# Patient Record
Sex: Male | Born: 1975 | Race: Black or African American | Hispanic: No | Marital: Married | Smoking: Former smoker
Health system: Southern US, Community
[De-identification: ages and names within clinical notes are randomized; demographics above are authoritative.]

## PROBLEM LIST (undated history)

## (undated) DIAGNOSIS — J189 Pneumonia, unspecified organism: Secondary | ICD-10-CM

## (undated) DIAGNOSIS — J329 Chronic sinusitis, unspecified: Secondary | ICD-10-CM

## (undated) DIAGNOSIS — U071 COVID-19: Secondary | ICD-10-CM

## (undated) DIAGNOSIS — R490 Dysphonia: Secondary | ICD-10-CM

## (undated) DIAGNOSIS — J381 Polyp of vocal cord and larynx: Secondary | ICD-10-CM

## (undated) DIAGNOSIS — B019 Varicella without complication: Secondary | ICD-10-CM

## (undated) DIAGNOSIS — F419 Anxiety disorder, unspecified: Secondary | ICD-10-CM

## (undated) DIAGNOSIS — E785 Hyperlipidemia, unspecified: Secondary | ICD-10-CM

## (undated) HISTORY — DX: Hyperlipidemia, unspecified: E78.5

## (undated) HISTORY — PX: OTHER SURGICAL HISTORY: SHX169

## (undated) HISTORY — DX: Anxiety disorder, unspecified: F41.9

## (undated) HISTORY — DX: COVID-19: U07.1

## (undated) HISTORY — DX: Chronic sinusitis, unspecified: J32.9

## (undated) HISTORY — DX: Dysphonia: R49.0

## (undated) HISTORY — DX: Polyp of vocal cord and larynx: J38.1

## (undated) HISTORY — PX: ROOT CANAL: SHX2363

## (undated) HISTORY — DX: Pneumonia, unspecified organism: J18.9

## (undated) HISTORY — DX: Varicella without complication: B01.9

---

## 2008-07-22 ENCOUNTER — Emergency Department: Payer: Self-pay | Admitting: Emergency Medicine

## 2008-10-20 ENCOUNTER — Emergency Department: Payer: Self-pay | Admitting: Unknown Physician Specialty

## 2010-06-30 ENCOUNTER — Emergency Department (HOSPITAL_COMMUNITY)
Admission: EM | Admit: 2010-06-30 | Discharge: 2010-06-30 | Disposition: A | Discharge status: Home / Self Care | Payer: Self-pay | Attending: Emergency Medicine | Admitting: Emergency Medicine

## 2010-06-30 DIAGNOSIS — L42 Pityriasis rosea: Secondary | ICD-10-CM | POA: Insufficient documentation

## 2010-06-30 DIAGNOSIS — R21 Rash and other nonspecific skin eruption: Secondary | ICD-10-CM | POA: Insufficient documentation

## 2010-06-30 DIAGNOSIS — L299 Pruritus, unspecified: Secondary | ICD-10-CM | POA: Insufficient documentation

## 2012-03-11 ENCOUNTER — Emergency Department: Payer: Self-pay | Admitting: Emergency Medicine

## 2012-11-09 ENCOUNTER — Emergency Department: Payer: Self-pay | Admitting: Emergency Medicine

## 2017-04-02 ENCOUNTER — Other Ambulatory Visit: Payer: Self-pay

## 2017-04-02 ENCOUNTER — Emergency Department
Admission: EM | Admit: 2017-04-02 | Discharge: 2017-04-02 | Disposition: A | Discharge status: Home / Self Care | Payer: 59 | Attending: Emergency Medicine | Admitting: Emergency Medicine

## 2017-04-02 ENCOUNTER — Emergency Department: Payer: 59

## 2017-04-02 DIAGNOSIS — R0789 Other chest pain: Secondary | ICD-10-CM | POA: Diagnosis not present

## 2017-04-02 DIAGNOSIS — F17210 Nicotine dependence, cigarettes, uncomplicated: Secondary | ICD-10-CM | POA: Insufficient documentation

## 2017-04-02 DIAGNOSIS — R002 Palpitations: Secondary | ICD-10-CM

## 2017-04-02 LAB — CBC
HCT: 44.4 % (ref 40.0–52.0)
Hemoglobin: 14.7 g/dL (ref 13.0–18.0)
MCH: 26.7 pg (ref 26.0–34.0)
MCHC: 33 g/dL (ref 32.0–36.0)
MCV: 80.8 fL (ref 80.0–100.0)
PLATELETS: 283 10*3/uL (ref 150–440)
RBC: 5.49 MIL/uL (ref 4.40–5.90)
RDW: 14.8 % — AB (ref 11.5–14.5)
WBC: 9 10*3/uL (ref 3.8–10.6)

## 2017-04-02 LAB — FIBRIN DERIVATIVES D-DIMER (ARMC ONLY): Fibrin derivatives D-dimer (ARMC): 251.67 ng/mL (FEU) (ref 0.00–499.00)

## 2017-04-02 LAB — BASIC METABOLIC PANEL
Anion gap: 11 (ref 5–15)
BUN: 15 mg/dL (ref 6–20)
CALCIUM: 9.5 mg/dL (ref 8.9–10.3)
CHLORIDE: 105 mmol/L (ref 101–111)
CO2: 23 mmol/L (ref 22–32)
CREATININE: 1.11 mg/dL (ref 0.61–1.24)
GFR calc Af Amer: >60 mL/min (ref >60)
GFR calc non Af Amer: >60 mL/min (ref >60)
Glucose, Bld: 112 mg/dL — ABNORMAL HIGH (ref 65–99)
Potassium: 3.5 mmol/L (ref 3.5–5.1)
SODIUM: 139 mmol/L (ref 135–145)

## 2017-04-02 LAB — TROPONIN I
Troponin I: <0.03 ng/mL (ref <0.03)
Troponin I: <0.03 ng/mL (ref <0.03)

## 2017-04-02 NOTE — ED Notes (Signed)
Pt states was playing video games, felt nauseous and "not right" so came to be seen. Also belching.

## 2017-04-02 NOTE — Discharge Instructions (Signed)
Return to the ER for new, worsening, or persistent chest discomfort, palpitations, difficulty breathing, weakness or lightheadedness, or any other new or worsening symptoms that concern you.  Follow-up with your primary care doctor, and you can also make an appointment with the cardiologist via the referral included here.

## 2017-04-02 NOTE — ED Provider Notes (Signed)
[facility] Emergency Department Provider Note ____________________________________________   First MD Initiated Contact with Patient 04/02/17 1734     (approximate)  I have reviewed the triage vital signs and the nursing notes.   HISTORY  Chief Complaint Chest Pain    HPI Christopher Small is a 42 y.o. male with no significant past medical history who presents with chest pain, acute onset approximately 2 hours ago while the patient was playing video games, described as discomfort, and associated with palpitations.  He also states that same time he became lightheaded and nauseous.  Patient states that the symptoms started to improve after about 5-10 minutes, and now have completely resolved.  No prior history of this symptom or similar episode.  Patient reports somewhat increased stress at work, but no acute stressor or anxiety preceding this episode.  He denies any leg swelling or acute pain.  He does report some tingling and "soreness" in the left side of his body over the last several days, but no weakness or numbness.   History reviewed. No pertinent past medical history.  There are no active problems to display for this patient.   History reviewed. No pertinent surgical history.  Prior to Admission medications   Not on File    Allergies Patient has no known allergies.  No family history on file.  Social History Social History   Tobacco Use  . Smoking status: Current Every Day Smoker    Types: Cigarettes  . Smokeless tobacco: Never Used  Substance Use Topics  . Alcohol use: Yes  . Drug use: Not on file    Review of Systems  Constitutional: No fever. Eyes: No visual changes. ENT: No neck pain. Cardiovascular: Positive for resolved chest pain. Respiratory: Denies shortness of breath. Gastrointestinal: Positive for resolved nausea.  No vomiting.  Genitourinary: Negative for flank pain.  Musculoskeletal: Negative for back pain. Skin:  Negative for rash. Neurological: Negative for headaches, focal weakness or numbness.   ____________________________________________   PHYSICAL EXAM:  VITAL SIGNS: ED Triage Vitals  Enc Vitals Group     BP 04/02/17 1520 (!) 163/79     Pulse Rate 04/02/17 1520 (!) 121     Resp 04/02/17 1520 18     Temp 04/02/17 1520 98.2 F (36.8 C)     Temp Source 04/02/17 1520 Oral     SpO2 04/02/17 1520 100 %     Weight 04/02/17 1518 225 lb (102.1 kg)     Height 04/02/17 1518 5\' 8"  (1.727 m)     Head Circumference --      Peak Flow --      Pain Score --      Pain Loc --      Pain Edu? --      Excl. in [place]? --     Constitutional: Alert and oriented. Well appearing and in no acute distress. Eyes: Conjunctivae are normal.  Head: Atraumatic. Nose: No congestion/rhinnorhea. Mouth/Throat: Mucous membranes are moist.   Neck: Normal range of motion.  Cardiovascular: Borderline rapid heart rate, regular rhythm. Grossly normal heart sounds.  Good peripheral circulation. Respiratory: Normal respiratory effort.  No retractions. Lungs CTAB. Gastrointestinal: Soft and nontender. No distention.  Genitourinary: No CVA tenderness. Musculoskeletal: No lower extremity edema.  Extremities warm and well perfused.  No calf or popliteal swelling or tenderness. Neurologic:  Normal speech and language. No gross focal neurologic deficits are appreciated.  Skin:  Skin is warm and dry. No rash noted. Psychiatric: Mood and  affect are normal. Speech and behavior are normal.  ____________________________________________   LABS (all labs ordered are listed, but only abnormal results are displayed)  Labs Reviewed  BASIC METABOLIC PANEL - Abnormal; Notable for the following components:      Result Value   Glucose, Bld 112 (*)    All other components within normal limits  CBC - Abnormal; Notable for the following components:   RDW 14.8 (*)    All other components within normal limits  TROPONIN I  FIBRIN  DERIVATIVES D-DIMER ([facility] ONLY)  TROPONIN I   ____________________________________________  EKG  ED ECG REPORT I, Arta Silence, the attending physician, personally viewed and interpreted this ECG.  Date: 04/02/2017 EKG Time: 1517 Rate: 119 Rhythm: Sinus tachycardia QRS Axis: normal Intervals: normal ST/T Wave abnormalities: Nonspecific ST flattening laterally Narrative Interpretation: no evidence of acute ischemia  ____________________________________________  RADIOLOGY  CXR: No focal infiltrate or other acute findings  ____________________________________________   PROCEDURES  Procedure(s) performed: No  Procedures  Critical Care performed: No ____________________________________________   INITIAL IMPRESSION / ASSESSMENT AND PLAN / ED COURSE  Pertinent labs & imaging results that were available during my care of the patient were reviewed by me and considered in my medical decision making (see chart for details).  42 year old male with no significant past medical history presents with atypical and now resolved chest discomfort and palpitations associated with lightheadedness and nausea that occurred while he was playing video games, and resolved after approximately 10 minutes.  No prior history of similar symptoms.  Past medical records reviewed in epic and are noncontributory.  On exam, the patient is well-appearing.  Heart rate was 120 on arrival, however by the time of my exam the patient's heart rate was just under 100.  Other vital signs are normal.  The remainder the exam is unremarkable.  Overall presentation is most consistent with anxiety, vasovagal near syncope, GERD, musculoskeletal pain, or other benign cause.  Differential also includes resolved arrhythmia, although the EKG does not show acute findings associated with any specific arrhythmia.  I have a lower suspicion for ACS given the patient's age and lack of risk factors.  PE is unlikely given  the resolved symptoms and improved tachycardia, however given that the patient did have unexplained tachycardia associated with this episode that was sustained for some time, will obtain a d-dimer to rule it out as well as troponins x2, chest x-ray, and basic labs.   ----------------------------------------- 7:17 PM on 04/02/2017 -----------------------------------------  Patient continues to be asymptomatic.  Heart rate has normalized to the 90s.  Repeat troponin and d-dimer are both negative.  I discussed the results of the workup and the possible causes with the patient.  At this time he is safe for discharge home.  He is arranging follow-up with a primary care doctor, and I also provided a referral to a cardiologist in our system.  Return precautions given, and the patient expresses understanding.  ____________________________________________   FINAL CLINICAL IMPRESSION(S) / ED DIAGNOSES  Final diagnoses:  Atypical chest pain  Palpitations      NEW MEDICATIONS STARTED DURING THIS VISIT:  New Prescriptions   No medications on file     Note:  This document was prepared using Dragon voice recognition software and may include unintentional dictation errors.    Arta Silence, MD 04/02/17 [phone]

## 2017-04-02 NOTE — ED Notes (Signed)
FIRST NURSE NOTE:  Pt reports tightness in chest and shortness of breath, pt ambulatory in lobby without difficulty. Pt declined wheelchair.

## 2017-04-02 NOTE — ED Triage Notes (Signed)
Pt states he was sitting down playing a game and had sudden onset chest pain with feeling like his heart was beating fast with SOB. States after a few minutes the sx resolved.the patient denies any pain or palpitations at present.

## 2017-05-25 ENCOUNTER — Ambulatory Visit: Payer: 59 | Admitting: Internal Medicine

## 2017-07-12 ENCOUNTER — Ambulatory Visit: Payer: 59 | Admitting: Internal Medicine

## 2017-07-12 ENCOUNTER — Encounter: Payer: Self-pay | Admitting: Internal Medicine

## 2017-07-12 VITALS — BP 130/80 | HR 75 | Temp 98.5°F | Ht 68.0 in | Wt 219.2 lb

## 2017-07-12 DIAGNOSIS — R490 Dysphonia: Secondary | ICD-10-CM

## 2017-07-12 DIAGNOSIS — K219 Gastro-esophageal reflux disease without esophagitis: Secondary | ICD-10-CM | POA: Diagnosis not present

## 2017-07-12 DIAGNOSIS — Z1329 Encounter for screening for other suspected endocrine disorder: Secondary | ICD-10-CM

## 2017-07-12 DIAGNOSIS — Z1159 Encounter for screening for other viral diseases: Secondary | ICD-10-CM | POA: Diagnosis not present

## 2017-07-12 DIAGNOSIS — R142 Eructation: Secondary | ICD-10-CM | POA: Diagnosis not present

## 2017-07-12 DIAGNOSIS — R739 Hyperglycemia, unspecified: Secondary | ICD-10-CM

## 2017-07-12 DIAGNOSIS — E559 Vitamin D deficiency, unspecified: Secondary | ICD-10-CM

## 2017-07-12 DIAGNOSIS — Z0184 Encounter for antibody response examination: Secondary | ICD-10-CM

## 2017-07-12 DIAGNOSIS — Z1389 Encounter for screening for other disorder: Secondary | ICD-10-CM | POA: Diagnosis not present

## 2017-07-12 DIAGNOSIS — Z0000 Encounter for general adult medical examination without abnormal findings: Secondary | ICD-10-CM | POA: Diagnosis not present

## 2017-07-12 DIAGNOSIS — Z13220 Encounter for screening for lipoid disorders: Secondary | ICD-10-CM | POA: Diagnosis not present

## 2017-07-12 DIAGNOSIS — R1310 Dysphagia, unspecified: Secondary | ICD-10-CM | POA: Diagnosis not present

## 2017-07-12 HISTORY — DX: Dysphonia: R49.0

## 2017-07-12 HISTORY — DX: Gastro-esophageal reflux disease without esophagitis: K21.9

## 2017-07-12 HISTORY — DX: Eructation: R14.2

## 2017-07-12 MED ORDER — PANTOPRAZOLE SODIUM 40 MG PO TBEC: 40.0000 mg | DELAYED_RELEASE_TABLET | Freq: Every day | ORAL | 0 refills | Status: DC

## 2017-07-12 NOTE — Progress Notes (Signed)
Patient not in [facility]. 

## 2017-07-12 NOTE — Progress Notes (Addendum)
Chief Complaint  Patient presents with  . New Patient (Initial Visit)   New patient  1. C/o since 03/2017 when he went to ED for URI (CXR neg 04/02/17) and racing heart, he has been belching a lot, hoarse in the am and feeling like something is stuck in his throat. Tried Nexium ? Dose and unknown if it helps. He does like gas causing foods I.e onions, garlic and brocolli 2. He reports when he was kid K-5th grade he had to use a voice box for some reason and his "glands" in his neck were swollen when disc'ed with mom recently. With h/o hoarseness and this history will refer to ENT  3. Tobacco abuse smoking since age 42.  Now down to 1-2 cig was max 1/2 ppd  No known FH lung cancer. Now using nicotine patch  Review of Systems  Constitutional: Negative for weight loss.  HENT:       +hoarse voice   Eyes: Negative for blurred vision.  Respiratory: Negative for shortness of breath.   Cardiovascular: Negative for chest pain.  Gastrointestinal:       +belching   Musculoskeletal: Negative for falls.  Skin: Negative for rash.  Neurological: Negative for headaches.  Psychiatric/Behavioral: Negative for depression.   Past Medical History:  Diagnosis Date  . Chicken pox   . Pneumonia    newborn   Past Surgical History:  Procedure Laterality Date  . ROOT CANAL     Family History  Problem Relation Age of Onset  . Diabetes Mother   . Hypertension Mother   . Stroke Mother   . Alcohol abuse Father   . Cancer Maternal Aunt        >type   . Diabetes Paternal Grandmother   . Heart disease Paternal Grandmother   . Cancer Paternal Grandmother        ?type cancer   . Stroke Paternal Grandfather   . Asthma Brother    Social History   Socioeconomic History  . Marital status: Married    Spouse name: Not on file  . Number of children: Not on file  . Years of education: Not on file  . Highest education level: Not on file  Occupational History  . Not on file  Social Needs  . Financial  resource strain: Not on file  . Food insecurity:    Worry: Not on file    Inability: Not on file  . Transportation needs:    Medical: Not on file    Non-medical: Not on file  Tobacco Use  . Smoking status: Current Every Day Smoker    Types: Cigarettes  . Smokeless tobacco: Never Used  . Tobacco comment: smoker since age 21 on and off now 1-2 cig/day max 1/2 ppd   Substance and Sexual Activity  . Alcohol use: Yes  . Drug use: Yes    Comment: thc sometimes   . Sexual activity: Yes  Lifestyle  . Physical activity:    Days per week: Not on file    Minutes per session: Not on file  . Stress: Not on file  Relationships  . Social connections:    Talks on phone: Not on file    Gets together: Not on file    Attends religious service: Not on file    Active member of club or organization: Not on file    Attends meetings of clubs or organizations: Not on file    Relationship status: Not on file  . Intimate partner violence:  Fear of current or ex partner: Not on file    Emotionally abused: Not on file    Physically abused: Not on file    Forced sexual activity: Not on file  Other Topics Concern  . Not on file  Social History Narrative   Married    Former Occupational psychologist    2 brothers    Current Meds  Medication Sig  . nicotine (NICODERM CQ - DOSED IN MG/24 HOURS) 21 mg/24hr patch Place 21 mg onto the skin daily.   No Known Allergies No results found for this or any previous visit (from the past 2160 hour(s)). Objective  Body mass index is 33.33 kg/m. Wt Readings from Last 3 Encounters:  07/12/17 219 lb 3.2 oz (99.4 kg)  04/02/17 225 lb (102.1 kg)   Temp Readings from Last 3 Encounters:  07/12/17 98.5 F (36.9 C) (Oral)  04/02/17 98.2 F (36.8 C) (Oral)   BP Readings from Last 3 Encounters:  07/12/17 130/80  04/02/17 128/89   Pulse Readings from Last 3 Encounters:  07/12/17 75  04/02/17 81    Physical Exam  Constitutional:  He is oriented to person, place, and time. Vital signs are normal. He appears well-developed and well-nourished. He is cooperative.  HENT:  Head: Normocephalic and atraumatic.  Mouth/Throat: Oropharynx is clear and moist and mucous membranes are normal.  Eyes: Pupils are equal, round, and reactive to light. Conjunctivae are normal.  Cardiovascular: Normal rate, regular rhythm and normal heart sounds.  Pulmonary/Chest: Effort normal and breath sounds normal.  Neurological: He is alert and oriented to person, place, and time. Gait normal.  Skin: Skin is warm, dry and intact.  Psychiatric: He has a normal mood and affect. His speech is normal and behavior is normal. Judgment and thought content normal. Cognition and memory are normal.  Nursing note and vitals reviewed.   Assessment   1. Hoarse voice  2. Belching and #1 c/w GERD, sensation of food getting stuck in throat/dysphagia  3. Tobacco abuse  4. HM Plan   07/26/17 saw Dr. Tami Ribas smooth polypoid mass left cord and nodule on right ? Inflamed polyp but bx to be sure pt to think about bx  1. Refer to GI and ENT to look into see HPI  2. See #1  Likely needs EGD Trial of protonix 40 rec smoking cessation  3. Nicotine patch  rec smoking cessation  4.  Never had flu shot  Think about pna 23  Tdap had 2012/13 check [facility]   sch fasting labs  Declines std check   Provider: Dr. Olivia Mackie McLean-Scocuzza-Internal Medicine

## 2017-07-12 NOTE — Progress Notes (Signed)
Pre visit review using our clinic review tool, if applicable. No additional management support is needed unless otherwise documented below in the visit note. 

## 2017-07-12 NOTE — Patient Instructions (Addendum)
We will refer you to [facility]  Think about pneumonia 23 vaccine    Gastroesophageal Reflux Disease, Adult Normally, food travels down the esophagus and stays in the stomach to be digested. If a person has gastroesophageal reflux disease (GERD), food and stomach acid move back up into the esophagus. When this happens, the esophagus becomes sore and swollen (inflamed). Over time, GERD can make small holes (ulcers) in the lining of the esophagus. Follow these instructions at home: Diet  Follow a diet as told by your doctor. You may need to avoid foods and drinks such as: ? Coffee and tea (with or without caffeine). ? Drinks that contain alcohol. ? Energy drinks and sports drinks. ? Carbonated drinks or sodas. ? Chocolate and cocoa. ? Peppermint and mint flavorings. ? Garlic and onions. ? Horseradish. ? Spicy and acidic foods, such as peppers, chili powder, curry powder, vinegar, hot sauces, and BBQ sauce. ? Citrus fruit juices and citrus fruits, such as oranges, lemons, and limes. ? Tomato-based foods, such as red sauce, chili, salsa, and pizza with red sauce. ? Fried and fatty foods, such as donuts, french fries, potato chips, and high-fat dressings. ? High-fat meats, such as hot dogs, rib eye steak, sausage, ham, and bacon. ? High-fat dairy items, such as whole milk, butter, and cream cheese.  Eat small meals often. Avoid eating large meals.  Avoid drinking large amounts of liquid with your meals.  Avoid eating meals during the 2-3 hours before bedtime.  Avoid lying down right after you eat.  Do not exercise right after you eat. General instructions  Pay attention to any changes in your symptoms.  Take over-the-counter and prescription medicines only as told by your doctor. Do not take aspirin, ibuprofen, or other NSAIDs unless your doctor says it is okay.  Do not use any tobacco products, including cigarettes, chewing tobacco, and e-cigarettes. If you need  help quitting, ask your doctor.  Wear loose clothes. Do not wear anything tight around your waist.  Raise (elevate) the head of your bed about 6 inches (15 cm).  Try to lower your stress. If you need help doing this, ask your doctor.  If you are overweight, lose an amount of weight that is healthy for you. Ask your doctor about a safe weight loss goal.  Keep all follow-up visits as told by your doctor. This is important. Contact a doctor if:  You have new symptoms.  You lose weight and you do not know why it is happening.  You have trouble swallowing, or it hurts to swallow.  You have wheezing or a cough that keeps happening.  Your symptoms do not get better with treatment.  You have a hoarse voice. Get help right away if:  You have pain in your arms, neck, jaw, teeth, or back.  You feel sweaty, dizzy, or light-headed.  You have chest pain or shortness of breath.  You throw up (vomit) and your throw up looks like blood or coffee grounds.  You pass out (faint).  Your poop (stool) is bloody or black.  You cannot swallow, drink, or eat. This information is not intended to replace advice given to you by your health care provider. Make sure you discuss any questions you have with your health care provider. Document Released: 06/29/2007 Document Revised: 06/18/2015 Document Reviewed: 05/07/2014 Elsevier Interactive Patient Education  2018 [employer] for Gastroesophageal Reflux Disease, Adult When you have gastroesophageal reflux disease (GERD), the foods  you eat and your eating habits are very important. Choosing the right foods can help ease your discomfort. What guidelines do I need to follow?  Choose fruits, vegetables, whole grains, and low-fat dairy products.  Choose low-fat meat, fish, and poultry.  Limit fats such as oils, salad dressings, butter, nuts, and avocado.  Keep a food diary. This helps you identify foods that cause  symptoms.  Avoid foods that cause symptoms. These may be different for everyone.  Eat small meals often instead of 3 large meals a day.  Eat your meals slowly, in a place where you are relaxed.  Limit fried foods.  Cook foods using methods other than frying.  Avoid drinking alcohol.  Avoid drinking large amounts of liquids with your meals.  Avoid bending over or lying down until 2-3 hours after eating. What foods are not recommended? These are some foods and drinks that may make your symptoms worse: Vegetables Tomatoes. Tomato juice. Tomato and spaghetti sauce. Chili peppers. Onion and garlic. Horseradish. Fruits Oranges, grapefruit, and lemon (fruit and juice). Meats High-fat meats, fish, and poultry. This includes hot dogs, ribs, ham, sausage, salami, and bacon. Dairy Whole milk and chocolate milk. Sour cream. Cream. Butter. Ice cream. Cream cheese. Drinks Coffee and tea. Bubbly (carbonated) drinks or energy drinks. Condiments Hot sauce. Barbecue sauce. Sweets/Desserts Chocolate and cocoa. Donuts. Peppermint and spearmint. Fats and Oils High-fat foods. This includes [place] fries and potato chips. Other Vinegar. Strong spices. This includes black pepper, white pepper, red pepper, cayenne, curry powder, cloves, ginger, and chili powder. The items listed above may not be a complete list of foods and drinks to avoid. Contact your dietitian for more information. This information is not intended to replace advice given to you by your health care provider. Make sure you discuss any questions you have with your health care provider. Document Released: 07/12/2011 Document Revised: 06/18/2015 Document Reviewed: 11/14/2012 Elsevier Interactive Patient Education  2017 [employer].    Pneumococcal Polysaccharide Vaccine: What You Need to Know 1. Why get vaccinated? Vaccination can protect older adults (and some children and younger adults) from pneumococcal disease. Pneumococcal  disease is caused by bacteria that can spread from person to person through close contact. It can cause ear infections, and it can also lead to more serious infections of the:  Lungs (pneumonia),  Blood (bacteremia), and  Covering of the brain and spinal cord (meningitis). Meningitis can cause deafness and brain damage, and it can be fatal.  Anyone can get pneumococcal disease, but children under 77 years of age, people with certain medical conditions, adults over 4 years of age, and cigarette smokers are at the highest risk. About 18,000 older adults die each year from pneumococcal disease in the [place]. Treatment of pneumococcal infections with penicillin and other drugs used to be more effective. But some strains of the disease have become resistant to these drugs. This makes prevention of the disease, through vaccination, even more important. 2. Pneumococcal polysaccharide vaccine (PPSV23) Pneumococcal polysaccharide vaccine (PPSV23) protects against 23 types of pneumococcal bacteria. It will not prevent all pneumococcal disease. PPSV23 is recommended for:  All adults 66 years of age and older,  Anyone 2 through 44 years of age with certain long-term health problems,  Anyone 2 through 44 years of age with a weakened immune system,  Adults 39 through 44 years of age who smoke cigarettes or have asthma.  Most people need only one dose of PPSV. A second dose is recommended for certain high-risk  groups. People 77 and older should get a dose even if they have gotten one or more doses of the vaccine before they turned 65. Your healthcare provider can give you more information about these recommendations. Most healthy adults develop protection within 2 to 3 weeks of getting the shot. 3. Some people should not get this vaccine  Anyone who has had a life-threatening allergic reaction to PPSV should not get another dose.  Anyone who has a severe allergy to any component of PPSV should  not receive it. Tell your provider if you have any severe allergies.  Anyone who is moderately or severely ill when the shot is scheduled may be asked to wait until they recover before getting the vaccine. Someone with a mild illness can usually be vaccinated.  Children less than 15 years of age should not receive this vaccine.  There is no evidence that PPSV is harmful to either a pregnant woman or to her fetus. However, as a precaution, women who need the vaccine should be vaccinated before becoming pregnant, if possible. 4. Risks of a vaccine reaction With any medicine, including vaccines, there is a chance of side effects. These are usually mild and go away on their own, but serious reactions are also possible. About half of people who get PPSV have mild side effects, such as redness or pain where the shot is given, which go away within about two days. Less than 1 out of 100 people develop a fever, muscle aches, or more severe local reactions. Problems that could happen after any vaccine:  People sometimes faint after a medical procedure, including vaccination. Sitting or lying down for about 15 minutes can help prevent fainting, and injuries caused by a fall. Tell your doctor if you feel dizzy, or have vision changes or ringing in the ears.  Some people get severe pain in the shoulder and have difficulty moving the arm where a shot was given. This happens very rarely.  Any medication can cause a severe allergic reaction. Such reactions from a vaccine are very rare, estimated at about 1 in a million doses, and would happen within a few minutes to a few hours after the vaccination. As with any medicine, there is a very remote chance of a vaccine causing a serious injury or death. The safety of vaccines is always being monitored. For more information, visit: http://www.aguilar.org/ 5. What if there is a serious reaction? What should I look for? Look for anything that concerns you, such as  signs of a severe allergic reaction, very high fever, or unusual behavior. Signs of a severe allergic reaction can include hives, swelling of the face and throat, difficulty breathing, a fast heartbeat, dizziness, and weakness. These would usually start a few minutes to a few hours after the vaccination. What should I do? If you think it is a severe allergic reaction or other emergency that can't wait, call 9-1-1 or get to the nearest hospital. Otherwise, call your doctor. Afterward, the reaction should be reported to the Vaccine Adverse Event Reporting System (VAERS). Your doctor might file this report, or you can do it yourself through the VAERS web site at www.vaers.SamedayNews.es, or by calling 904 015 6512. VAERS does not give medical advice. 6. How can I learn more?  Ask your doctor. He or she can give you the vaccine package insert or suggest other sources of information.  Call your local or state health department.  Contact the Centers for Disease Control and Prevention (CDC): ? Call 331-744-1258 (1-800-CDC-INFO)  or ? Visit CDC's website at http://hunter.com/ CDC Pneumococcal Polysaccharide Vaccine VIS (05/17/13) This information is not intended to replace advice given to you by your health care provider. Make sure you discuss any questions you have with your health care provider. Document Released: 11/07/2005 Document Revised: 10/01/2015 Document Reviewed: 10/01/2015 Elsevier Interactive Patient Education  2017 [employer].

## 2017-07-26 DIAGNOSIS — Z720 Tobacco use: Secondary | ICD-10-CM | POA: Diagnosis not present

## 2017-07-26 DIAGNOSIS — R490 Dysphonia: Secondary | ICD-10-CM | POA: Diagnosis not present

## 2017-08-01 ENCOUNTER — Other Ambulatory Visit: Payer: Self-pay

## 2017-08-01 ENCOUNTER — Encounter: Payer: Self-pay | Admitting: Gastroenterology

## 2017-08-01 ENCOUNTER — Ambulatory Visit (INDEPENDENT_AMBULATORY_CARE_PROVIDER_SITE_OTHER): Payer: 59 | Admitting: Gastroenterology

## 2017-08-01 VITALS — BP 120/80 | HR 83 | Resp 17 | Ht 68.0 in | Wt 214.2 lb

## 2017-08-01 DIAGNOSIS — R0989 Other specified symptoms and signs involving the circulatory and respiratory systems: Secondary | ICD-10-CM

## 2017-08-01 DIAGNOSIS — K219 Gastro-esophageal reflux disease without esophagitis: Secondary | ICD-10-CM

## 2017-08-01 DIAGNOSIS — F458 Other somatoform disorders: Secondary | ICD-10-CM

## 2017-08-01 DIAGNOSIS — R198 Other specified symptoms and signs involving the digestive system and abdomen: Secondary | ICD-10-CM

## 2017-08-01 MED ORDER — PANTOPRAZOLE SODIUM 40 MG PO TBEC: 40.0000 mg | DELAYED_RELEASE_TABLET | Freq: Two times a day (BID) | ORAL | 0 refills | Status: DC

## 2017-08-01 NOTE — Progress Notes (Signed)
[name] MD, MRCP(U.K) [address]  [place]  [place], [place] [address]  Main: [phone]  Fax: [phone]   Gastroenterology Consultation  Referring Provider:     McLean-Scocuzza, Olivia Mackie * Primary Care Physician:  McLean-Scocuzza, Nino Glow, MD Primary Gastroenterologist:  Dr. [name]  Reason for Consultation:     Atypical chest pain         HPI:   Christopher Small is a 42 y.o. y/o male referred for consultation & management  by Dr. Terese Door, Nino Glow, MD.    He was seen at the ER in 03/2017 for atypial chest pain . Seen by McLean-Scocuzza, Nino Glow, MD in 06/2017 .   He says that he has a constant feeling something is in his throat for more than a month, has the sensation most of the time. Feels something in the upper part of the chest . He has noticed that he has a lot of mucus at the back of his throat. He has a hoarse voice and has been evaluated by ENT. Denies any dysphagia, on and off heart burn . No weight loss. Quit smoking 6 weeks back . He takes Prilosec since last 4 weeks, takes it before meals. It has helped "just a bit"   Lost 4-5 lbs recently - unclear if intentional or not.   Past Medical History:  Diagnosis Date  . Chicken pox   . Pneumonia    newborn    Past Surgical History:  Procedure Laterality Date  . ROOT CANAL      Prior to Admission medications   Medication Sig Start Date End Date Taking? Authorizing Provider  nicotine (NICODERM CQ - DOSED IN MG/24 HOURS) 21 mg/24hr patch Place 21 mg onto the skin daily.   Yes [provider]  pantoprazole (PROTONIX) 40 MG tablet Take 1 tablet (40 mg total) by mouth daily. In am before food 07/12/17  Yes McLean-Scocuzza, Nino Glow, MD  amoxicillin (AMOXIL) 500 MG capsule TAKE 1 CAPSULE BY MOUTH 4 TIMES DAILY UNTIL GONE 07/13/17   [provider]    Family History  Problem Relation Age of Onset  . Diabetes Mother   . Hypertension Mother   . Stroke Mother   . Alcohol abuse  Father   . Cancer Maternal Aunt        >type   . Diabetes Paternal Grandmother   . Heart disease Paternal Grandmother   . Cancer Paternal Grandmother        ?type cancer   . Stroke Paternal Grandfather   . Asthma Brother      Social History   Tobacco Use  . Smoking status: Current Every Day Smoker    Types: Cigarettes  . Smokeless tobacco: Never Used  . Tobacco comment: smoker since age 70 on and off now 1-2 cig/day max 1/2 ppd   Substance Use Topics  . Alcohol use: Yes  . Drug use: Yes    Comment: thc sometimes     Allergies as of 08/01/2017  . (No Known Allergies)    Review of Systems:    All systems reviewed and negative except where noted in HPI.   Physical Exam:  BP 120/80 (BP Location: Left Arm, Patient Position: Sitting, Cuff Size: Large)   Pulse 83   Resp 17   Ht 5\' 8"  (1.727 m)   Wt 214 lb 3.2 oz (97.2 kg)   BMI 32.57 kg/m  No LMP for male patient. Psych:  Alert and cooperative. Normal mood  and affect. General:   Alert,  Well-developed, well-nourished, pleasant and cooperative in NAD Head:  Normocephalic and atraumatic. Eyes:  Sclera clear, no icterus.   Conjunctiva pink. Ears:  Normal auditory acuity. Nose:  No deformity, discharge, or lesions. Mouth:  No deformity or lesions,oropharynx pink & moist. Neck:  Supple; no masses or thyromegaly. Lungs:  Respirations even and unlabored.  Clear throughout to auscultation.   No wheezes, crackles, or rhonchi. No acute distress. Heart:  Regular rate and rhythm; no murmurs, clicks, rubs, or gallops. Abdomen:  Normal bowel sounds.  No bruits.  Soft, non-tender and non-distended without masses, hepatosplenomegaly or hernias noted.  No guarding or rebound tenderness.    Neurologic:  Alert and oriented x3;  grossly normal neurologically. Skin:  Intact without significant lesions or rashes. No jaundice. Lymph Nodes:  No significant cervical adenopathy. Psych:  Alert and cooperative. Normal mood and affect.  Imaging  Studies: No results found.  Assessment and Plan:   Christopher Small is a 42 y.o. y/o male has been referred for atypical chest pain. His symptoms predominantly suggest a probable globus sensation. Does also have a history suggestive of acid reflux.   Plan  1. Increase Pirlosec to 40 mg BID.  2. Counseled on life style changes for GERD- patient information provided 3. EGD , r/o EOE 4. IF above are negative can consider esophageal manometry to r/o a spasm.  Follow up in 8 weeks   Dr [name] MD,MRCP(U.K)

## 2017-08-02 ENCOUNTER — Other Ambulatory Visit (INDEPENDENT_AMBULATORY_CARE_PROVIDER_SITE_OTHER): Payer: 59

## 2017-08-02 DIAGNOSIS — Z0184 Encounter for antibody response examination: Secondary | ICD-10-CM

## 2017-08-02 DIAGNOSIS — Z13220 Encounter for screening for lipoid disorders: Secondary | ICD-10-CM

## 2017-08-02 DIAGNOSIS — Z0000 Encounter for general adult medical examination without abnormal findings: Secondary | ICD-10-CM | POA: Diagnosis not present

## 2017-08-02 DIAGNOSIS — K219 Gastro-esophageal reflux disease without esophagitis: Secondary | ICD-10-CM | POA: Diagnosis not present

## 2017-08-02 DIAGNOSIS — R739 Hyperglycemia, unspecified: Secondary | ICD-10-CM | POA: Diagnosis not present

## 2017-08-02 DIAGNOSIS — E559 Vitamin D deficiency, unspecified: Secondary | ICD-10-CM | POA: Diagnosis not present

## 2017-08-02 DIAGNOSIS — Z1329 Encounter for screening for other suspected endocrine disorder: Secondary | ICD-10-CM | POA: Diagnosis not present

## 2017-08-02 DIAGNOSIS — R490 Dysphonia: Secondary | ICD-10-CM | POA: Diagnosis not present

## 2017-08-02 DIAGNOSIS — Z1159 Encounter for screening for other viral diseases: Secondary | ICD-10-CM

## 2017-08-02 LAB — LIPID PANEL
CHOLESTEROL: 232 mg/dL — AB (ref 0–200)
HDL: 34.9 mg/dL — ABNORMAL LOW (ref >39.00)
LDL CALC: 171 mg/dL — AB (ref 0–99)
NonHDL: 197.45
Total CHOL/HDL Ratio: 7
Triglycerides: 133 mg/dL (ref 0.0–149.0)
VLDL: 26.6 mg/dL (ref 0.0–40.0)

## 2017-08-02 LAB — T4, FREE: Free T4: 1.08 ng/dL (ref 0.60–1.60)

## 2017-08-02 LAB — HEPATIC FUNCTION PANEL
ALBUMIN: 4.4 g/dL (ref 3.5–5.2)
ALK PHOS: 60 U/L (ref 39–117)
ALT: 22 U/L (ref 0–53)
AST: 15 U/L (ref 0–37)
Bilirubin, Direct: 0.1 mg/dL (ref 0.0–0.3)
Total Bilirubin: 0.6 mg/dL (ref 0.2–1.2)
Total Protein: 6.5 g/dL (ref 6.0–8.3)

## 2017-08-02 LAB — TSH: TSH: 1.58 u[IU]/mL (ref 0.35–4.50)

## 2017-08-02 LAB — HEMOGLOBIN A1C: Hgb A1c MFr Bld: 5.9 % (ref 4.6–6.5)

## 2017-08-02 LAB — VITAMIN D 25 HYDROXY (VIT D DEFICIENCY, FRACTURES): VITD: 9.91 ng/mL — ABNORMAL LOW (ref 30.00–100.00)

## 2017-08-03 ENCOUNTER — Other Ambulatory Visit: Payer: Self-pay | Admitting: Internal Medicine

## 2017-08-03 DIAGNOSIS — E559 Vitamin D deficiency, unspecified: Secondary | ICD-10-CM

## 2017-08-03 LAB — MEASLES/MUMPS/RUBELLA IMMUNITY
Mumps IgG: 65.7 AU/mL
Rubella: 4.64 index
Rubeola IgG: >300 AU/mL

## 2017-08-03 LAB — HEPATITIS B SURFACE ANTIBODY, QUANTITATIVE: Hepatitis B-Post: <5 m[IU]/mL — ABNORMAL LOW (ref >=10)

## 2017-08-03 MED ORDER — CHOLECALCIFEROL 1.25 MG (50000 UT) PO CAPS: 50000.0000 [IU] | ORAL_CAPSULE | ORAL | 1 refills | Status: DC

## 2017-08-04 ENCOUNTER — Encounter: Payer: Self-pay | Admitting: Speech Pathology

## 2017-08-04 ENCOUNTER — Ambulatory Visit: Payer: 59 | Attending: Unknown Physician Specialty | Admitting: Speech Pathology

## 2017-08-04 DIAGNOSIS — R490 Dysphonia: Secondary | ICD-10-CM | POA: Insufficient documentation

## 2017-08-04 NOTE — Therapy (Signed)
[place] MAIN [facility] SERVICES [address] [place], [place], [address] Phone: [phone]   Fax:  [phone]  Speech Language Pathology Evaluation  Patient Details  Name: Christopher Small MRN: [id number] Date of Birth: 27-Apr-1975 Referring Provider: Dr. Tami Ribas   Encounter Date: 08/04/2017  End of Session - 08/04/17 1042    Visit Number  1    Number of Visits  14    Date for SLP Re-Evaluation  09/22/17    SLP Start Time  0903    SLP Stop Time   1000    SLP Time Calculation (min)  57 min    Activity Tolerance  Patient tolerated treatment well       Past Medical History:  Diagnosis Date  . Chicken pox   . Pneumonia    newborn    Past Surgical History:  Procedure Laterality Date  . ROOT CANAL      There were no vitals filed for this visit.  Subjective Assessment - 08/04/17 1018    Subjective  Pt was cooperative with evaluation, eager to improve his voice.     Currently in Pain?  No/denies         SLP Evaluation [facility] - 08/04/17 1018      SLP Visit Information   SLP Received On  08/04/17    Referring Provider  Dr. Tami Ribas    Onset Date  March 2019    Medical Diagnosis  Small Left vocal cord polyp/hoarseness      Subjective   Subjective  Pt was cooperative with evaluation, pleasant and able to follow directions well.     Patient/Family Stated Goal  To get his old voice back      General Information   HPI  Pt is a 42 y/o male who was referred by ENT for a left vocal cord polyp and hoarseness. Per ENT noted examination of the larynx revealed a "right vocal cord nodule, left with nodule and smooth appearing polypoid mass on the left cord adjacent to the right nodule." Pt has a h/o smoking (now quitting), excessive vocal use, and recent diagnosis of reflux. Pt reports that he had voice therapy as a young child for nodules.     Behavioral/Cognition  WFL    Mobility Status  ambulatory      Balance Screen   Has the patient  fallen in the past 6 months  No    Has the patient had a decrease in activity level because of a fear of falling?   No    Is the patient reluctant to leave their home because of a fear of falling?   No      Oral Motor/Sensory Function   Overall Oral Motor/Sensory Function  Appears within functional limits for tasks assessed      Motor Speech   Overall Motor Speech  Impaired    Respiration  Within functional limits    Phonation  Hoarse;Other (comment) Raspy    Resonance  Within functional limits    Articulation  Within functional limitis    Intelligibility  Intelligible    Phonation  Impaired    Vocal Abuses  Prolonged Vocal Use;Habitual Cough/Throat Clear;Habitual Hyperphonia;Smoking;Other (comment);Glottal Attack Reflux    Volume  Appropriate Pt reports he talks loud and shouts frequently    Pitch  Low      Standardized Assessments   Standardized Assessments   Other Assessment Perceptural Voice evaluation         Perceptual Voice Evaluation  Voice history: Pt reports that his hoarseness began after having difficulties with reflux and an upper respiratory infection in March 2019. Pt reports that his voice is typically worse in the mornings and will improve over the day. He also states that he is a "loud" talker and that he has a remote h/o voice problems when he was a child.   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 (Ascore of 10 or higher  indicates a perceived handicap)  Max phonation time for sustained "ah":  8 seconds Average fundamental frequency during sustained "ah": 123.48 (Norm 145) Just within normal range Average time patient was able to sustain /s/: 5.26 seconds Average time patient was able to sustain /z/: 5.18 seconds S/z ratio: 1.01 Highest dynamic pitch when altering from a low to high note: 102.17 Hz Lowest dynamic pitch when altering from a low to high note: 306.67 Hz Highest pitch in connected speech: 71.46 Hz Lowest pitch in connected speech: 466.67 Hz   Visi-Pitch: Multi-dimensional Voice Program (MDVP) MDVP extracts objective quantitative values (Relative Average Perturbation, Shimmer, Voice Turbulence Index, and Noise to Harmonic Ratio) on sustained phonation, which are displayed graphically and numerically in comparison to a built-in normative data base. The patient exhibited values which were outside the norms set for Relative Average Perturbation, Shimmer, Voice Turbulence Index, and Noise to Harmonic Ratio. Average fundamental frequency was just within normal range for age and gender. Perceptually, his voice was raspy and strained with intermittent breaks. Patient was able to improve his quality with cues to produce easy onset/relaxed phonation.   Education: Patient was instructed on vocal rest and vocal hygiene (especially in the workplace).               SLP Education - 08/04/17 1041    Education Details  Vocal hygiene, Plan of care, goals of voice therapy    Person(s) Educated  Patient    Methods  Explanation    Comprehension  Need further instruction         SLP Long Term Goals - 08/04/17 1048      SLP LONG TERM GOAL #1   Title  The patient will demonstrate indepedent understanding of vocal hygiene concepts and extrinsic muscle stretches.    Time  6    Period  Weeks    Status  New      SLP LONG TERM GOAL #2   Title  The patient will minimize vocal tension via resonant voice  therapy approach (or comparable technique) with min SLP cues with 80% accuracy.    Time  6    Period  Weeks    Status  New      SLP LONG TERM GOAL #3   Title  The patient will maintain relaxed phonation/oral resonance for paragraph recitation with 80% accuracy and min SLP cues.     Time  6    Period  Weeks    Status  New       Plan -  2017-08-24 1045    Clinical Impression Statement  This 42 y/o male under the care of Dr. 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 The patient will benefit from Voice therapy for education (vocal hygiene, reflux precautions), to reduce laryngeal tension, and relaxed phonation/oal resonanace. The patient was able to improve vocal quality with use of easy onset/relaxed phonation.     Speech Therapy Frequency  2x / week    Duration  Other (comment) 6 weeks    Treatment/Interventions  Patient/family education;SLP instruction and feedback;Other (comment) Voice therapy    Potential to Achieve Goals  Good    SLP Home Exercise Plan  Use of good vocal hygiene, easy onsets    Consulted and Agree with Plan of Care  Patient       Patient will benefit from skilled therapeutic intervention in order to improve the following deficits and impairments:   Dysphonia  G-Codes - August 24, 2017 1106    Functional Assessment Tool Used  Perceptual Voice Evaluation, clinical judgement    Functional Limitations  Voice    Voice Current Status (G9171)  At least 40 percent but less than 60 percent impaired, limited or restricted    Voice Goal Status (G9172)  At least 40 percent but less than 60 percent impaired, limited or restricted       Problem List Patient Active Problem List   Diagnosis Date Noted  . Hoarseness 07/12/2017  . Gastroesophageal reflux disease 07/12/2017  .  Belching 07/12/2017   Charlean Sanfilippo, MA, CCC-SLP  Speech-Language Pathologist   [place] 2017/08/24, 1:01 PM  [place] MAIN [facility] SERVICES [address] [place], [place], [address] Phone: [phone]   Fax:  [phone]  Name: Christopher Small MRN: [id number] Date of Birth: 03/04/1975

## 2017-08-07 ENCOUNTER — Ambulatory Visit
Admission: RE | Admit: 2017-08-07 | Discharge: 2017-08-07 | Disposition: A | Discharge status: Home / Self Care | Payer: 59 | Source: Ambulatory Visit | Attending: Gastroenterology | Admitting: Gastroenterology

## 2017-08-07 ENCOUNTER — Ambulatory Visit: Payer: 59 | Admitting: Anesthesiology

## 2017-08-07 ENCOUNTER — Encounter
Admission: RE | Disposition: A | Discharge status: Home / Self Care | Payer: Self-pay | Source: Ambulatory Visit | Attending: Gastroenterology

## 2017-08-07 ENCOUNTER — Encounter: Payer: Self-pay | Admitting: Anesthesiology

## 2017-08-07 DIAGNOSIS — Z79899 Other long term (current) drug therapy: Secondary | ICD-10-CM | POA: Diagnosis not present

## 2017-08-07 DIAGNOSIS — K219 Gastro-esophageal reflux disease without esophagitis: Secondary | ICD-10-CM

## 2017-08-07 DIAGNOSIS — F458 Other somatoform disorders: Secondary | ICD-10-CM | POA: Diagnosis present

## 2017-08-07 HISTORY — PX: ESOPHAGOGASTRODUODENOSCOPY (EGD) WITH PROPOFOL: SHX5813

## 2017-08-07 SURGERY — ESOPHAGOGASTRODUODENOSCOPY (EGD) WITH PROPOFOL
Anesthesia: General

## 2017-08-07 MED ORDER — PROPOFOL 10 MG/ML IV BOLUS
INTRAVENOUS | Status: DC | PRN
  Administered 2017-08-07: 100 mg via INTRAVENOUS
  Administered 2017-08-07: 30 mg via INTRAVENOUS

## 2017-08-07 MED ORDER — SODIUM CHLORIDE 0.9 % IV SOLN
INTRAVENOUS | Status: DC
  Administered 2017-08-07: 10:00:00 via INTRAVENOUS
  Administered 2017-08-07: 1000 mL via INTRAVENOUS

## 2017-08-07 MED ORDER — PROPOFOL 500 MG/50ML IV EMUL
INTRAVENOUS | Status: DC | PRN
  Administered 2017-08-07: 150 ug/kg/min via INTRAVENOUS

## 2017-08-07 NOTE — Anesthesia Procedure Notes (Signed)
Date/Time: 08/07/2017 10:37 AM Performed by: Nelda Marseille, CRNA Pre-anesthesia Checklist: Patient identified, Emergency Drugs available, Suction available, Patient being monitored and Timeout performed Oxygen Delivery Method: Nasal cannula

## 2017-08-07 NOTE — Anesthesia Preprocedure Evaluation (Signed)
Anesthesia Evaluation  Patient identified by MRN, date of birth, ID band Patient awake    Reviewed: Allergy & Precautions, H&P , NPO status , Patient's Chart, lab work & pertinent test results, reviewed documented beta blocker date and time   History of Anesthesia Complications Negative for: history of anesthetic complications  Airway Mallampati: I  TM Distance: >3 FB Neck ROM: full    Dental  (+) Dental Advidsory Given, Missing, Teeth Intact Permanent bridge on the top:   Pulmonary neg shortness of breath, neg COPD, neg recent URI, Current Smoker,           Cardiovascular Exercise Tolerance: Good negative cardio ROS       Neuro/Psych negative neurological ROS  negative psych ROS   GI/Hepatic Neg liver ROS, GERD  ,  Endo/Other  negative endocrine ROS  Renal/GU negative Renal ROS  negative genitourinary   Musculoskeletal   Abdominal   Peds  Hematology negative hematology ROS (+)   Anesthesia Other Findings Past Medical History: No date: Chicken pox No date: Pneumonia     Comment:  newborn   Reproductive/Obstetrics negative OB ROS                             Anesthesia Physical Anesthesia Plan  ASA: II  Anesthesia Plan: General   Post-op Pain Management:    Induction: Intravenous  PONV Risk Score and Plan: 1 and Propofol infusion and TIVA  Airway Management Planned: Nasal Cannula  Additional Equipment:   Intra-op Plan:   Post-operative Plan:   Informed Consent: I have reviewed the patients History and Physical, chart, labs and discussed the procedure including the risks, benefits and alternatives for the proposed anesthesia with the patient or authorized representative who has indicated his/her understanding and acceptance.   Dental Advisory Given  Plan Discussed with: Anesthesiologist, CRNA and Surgeon  Anesthesia Plan Comments:         Anesthesia Quick  Evaluation

## 2017-08-07 NOTE — Op Note (Signed)
[facility] Gastroenterology Patient Name: Christopher Small Procedure Date: 08/07/2017 10:26 AM MRN: [id number] Account #: [id number] Date of Birth: Feb 09, 1975 Admit Type: Outpatient Age: 42 Room: [facility] ENDO ROOM 4 Gender: Male Note Status: Finalized Procedure:            Upper GI endoscopy Indications:          Globus sensation Providers:            [name] MD, MD Referring MD:         Nino Glow Mclean-Scocuzza MD, MD (Referring MD) Medicines:            Monitored Anesthesia Care Complications:        No immediate complications. Procedure:            Pre-Anesthesia Assessment:                       - Prior to the procedure, a History and Physical was                        performed, and patient medications, allergies and                        sensitivities were reviewed. The patient's tolerance of                        previous anesthesia was reviewed.                       - The risks and benefits of the procedure and the                        sedation options and risks were discussed with the                        patient. All questions were answered and informed                        consent was obtained.                       - ASA Grade Assessment: II - A patient with mild                        systemic disease.                       After obtaining informed consent, the endoscope was                        passed under direct vision. Throughout the procedure,                        the patient's blood pressure, pulse, and oxygen                        saturations were monitored continuously. The Endoscope                        was introduced through the mouth, and advanced to the  third part of duodenum. The upper GI endoscopy was                        accomplished with ease. The patient tolerated the                        procedure well. Findings:      The examined duodenum was normal.      The stomach was normal.      The  cardia and gastric fundus were normal on retroflexion.      The examined esophagus was normal. Biopsies were taken with a cold       forceps for histology. Impression:           - Normal examined duodenum.                       - Normal stomach.                       - Normal esophagus. Biopsied. Recommendation:       - Await pathology results.                       - Discharge patient to home (with escort).                       - Resume previous diet.                       - Continue present medications.                       - Await pathology results.                       - Return to my office in 6 weeks. 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 All rights reserved. The codes documented in this report are preliminary and upon coder review may  be revised to meet current compliance requirements. [name], MD [name] MD, MD 08/07/2017 10:43:48 AM This report has been signed electronically. Number of Addenda: 0 Note Initiated On: 08/07/2017 10:26 AM      [facility]

## 2017-08-07 NOTE — Anesthesia Postprocedure Evaluation (Signed)
Anesthesia Post Note  Patient: Christopher Small  Procedure(s) Performed: ESOPHAGOGASTRODUODENOSCOPY (EGD) WITH PROPOFOL (N/A )  Patient location during evaluation: Endoscopy Anesthesia Type: General Level of consciousness: awake and alert Pain management: pain level controlled Vital Signs Assessment: post-procedure vital signs reviewed and stable Respiratory status: spontaneous breathing, nonlabored ventilation, respiratory function stable and patient connected to nasal cannula oxygen Cardiovascular status: blood pressure returned to baseline and stable Postop Assessment: no apparent nausea or vomiting Anesthetic complications: no     Last Vitals:  Vitals:   08/07/17 1105 08/07/17 1115  BP: 121/79 123/82  Pulse: 69 64  Resp: 14 16  Temp:    SpO2: 100% 100%    Last Pain:  Vitals:   08/07/17 1053  TempSrc:   PainSc: 0-No pain                 Martha Clan

## 2017-08-07 NOTE — Transfer of Care (Signed)
Immediate Anesthesia Transfer of Care Note  Patient: Christopher Small  Procedure(s) Performed: ESOPHAGOGASTRODUODENOSCOPY (EGD) WITH PROPOFOL (N/A )  Patient Location: PACU  Anesthesia Type:General  Level of Consciousness: sedated  Airway & Oxygen Therapy: Patient Spontanous Breathing and Patient connected to nasal cannula oxygen  Post-op Assessment: Report given to RN and Post -op Vital signs reviewed and stable  Post vital signs: Reviewed and stable  Last Vitals:  Vitals Value Taken Time  BP    Temp    Pulse 78 08/07/2017 10:46 AM  Resp 22 08/07/2017 10:46 AM  SpO2 96 % 08/07/2017 10:46 AM  Vitals shown include unvalidated device data.  Last Pain:  Vitals:   08/07/17 0933  TempSrc: Tympanic  PainSc: 0-No pain         Complications: No apparent anesthesia complications

## 2017-08-07 NOTE — H&P (Signed)
Christopher Bellows, MD [address] [place], [place], [place], [address] [address], [place], [place], [place], [address] Phone: [phone]  Fax: [phone]  Primary Care Physician:  McLean-Scocuzza, Christopher Glow, MD   Pre-Procedure History & Physical: HPI:  Christopher Small is a 42 y.o. male is here for an endoscopy    Past Medical History:  Diagnosis Date  . Chicken pox   . Pneumonia    newborn    Past Surgical History:  Procedure Laterality Date  . ROOT CANAL      Prior to Admission medications   Medication Sig Start Date End Date Taking? Authorizing Provider  amoxicillin (AMOXIL) 500 MG capsule TAKE 1 CAPSULE BY MOUTH 4 TIMES DAILY UNTIL GONE 07/13/17  Yes [provider]  Cholecalciferol 50000 units capsule Take 1 capsule (50,000 Units total) by mouth once a week. 08/03/17  Yes McLean-Scocuzza, Christopher Glow, MD  nicotine (NICODERM CQ - DOSED IN MG/24 HOURS) 21 mg/24hr patch Place 21 mg onto the skin daily.   Yes [provider]  pantoprazole (PROTONIX) 40 MG tablet Take 1 tablet (40 mg total) by mouth 2 (two) times daily. In am before food 08/01/17 10/02/17 Yes Christopher Bellows, MD    Allergies as of 08/01/2017  . (No Known Allergies)    Family History  Problem Relation Age of Onset  . Diabetes Mother   . Hypertension Mother   . Stroke Mother   . Alcohol abuse Father   . Cancer Maternal Aunt        >type   . Diabetes Paternal Grandmother   . Heart disease Paternal Grandmother   . Cancer Paternal Grandmother        ?type cancer   . Stroke Paternal Grandfather   . Asthma Brother     Social History   Socioeconomic History  . Marital status: Married    Spouse name: Not on file  . Number of children: Not on file  . Years of education: Not on file  . Highest education level: Not on file  Occupational History  . Not on file  Social Needs  . Financial resource strain: Not on file  . Food insecurity:    Worry: Not on file    Inability: Not on file    . Transportation needs:    Medical: Not on file    Non-medical: Not on file  Tobacco Use  . Smoking status: Current Every Day Smoker    Types: Cigarettes  . Smokeless tobacco: Never Used  . Tobacco comment: smoker since age 48 on and off now 1-2 cig/day max 1/2 ppd   Substance and Sexual Activity  . Alcohol use: Yes  . Drug use: Yes    Comment: thc sometimes   . Sexual activity: Yes  Lifestyle  . Physical activity:    Days per week: Not on file    Minutes per session: Not on file  . Stress: Not on file  Relationships  . Social connections:    Talks on phone: Not on file    Gets together: Not on file    Attends religious service: Not on file    Active member of club or organization: Not on file    Attends meetings of clubs or organizations: Not on file    Relationship status: Not on file  . Intimate partner violence:    Fear of current or ex partner: Not on file    Emotionally abused: Not on file  Physically abused: Not on file    Forced sexual activity: Not on file  Other Topics Concern  . Not on file  Social History Narrative   Married    Former Occupational psychologist    2 brothers     Review of Systems: See HPI, otherwise negative ROS  Physical Exam: BP 125/81   Pulse 74   Temp (!) 96.2 F (35.7 C) (Tympanic)   Resp 20   Ht 5\' 8"  (1.727 m)   Wt 220 lb (99.8 kg)   SpO2 100%   BMI 33.45 kg/m  General:   Alert,  pleasant and cooperative in NAD Head:  Normocephalic and atraumatic. Neck:  Supple; no masses or thyromegaly. Lungs:  Clear throughout to auscultation, normal respiratory effort.    Heart:  +S1, +S2, Regular rate and rhythm, No edema. Abdomen:  Soft, nontender and nondistended. Normal bowel sounds, without guarding, and without rebound.   Neurologic:  Alert and  oriented x4;  grossly normal neurologically.  Impression/Plan: [name] is here for an endoscopy  to be performed for  evaluation of globus  sensation    Risks, benefits, limitations, and alternatives regarding endoscopy have been reviewed with the patient.  Questions have been answered.  All parties agreeable.   Christopher Bellows, MD  08/07/2017, 10:24 AM

## 2017-08-07 NOTE — Anesthesia Post-op Follow-up Note (Signed)
Anesthesia QCDR form completed.        

## 2017-08-08 ENCOUNTER — Encounter: Payer: Self-pay | Admitting: Gastroenterology

## 2017-08-08 LAB — SURGICAL PATHOLOGY

## 2017-08-11 ENCOUNTER — Ambulatory Visit: Payer: 59 | Admitting: Speech Pathology

## 2017-08-14 ENCOUNTER — Ambulatory Visit: Payer: 59 | Admitting: Internal Medicine

## 2017-08-14 ENCOUNTER — Encounter: Payer: Self-pay | Admitting: Internal Medicine

## 2017-08-14 VITALS — BP 132/80 | HR 80 | Temp 98.1°F | Ht 68.0 in | Wt 215.4 lb

## 2017-08-14 DIAGNOSIS — E785 Hyperlipidemia, unspecified: Secondary | ICD-10-CM | POA: Insufficient documentation

## 2017-08-14 DIAGNOSIS — E559 Vitamin D deficiency, unspecified: Secondary | ICD-10-CM | POA: Diagnosis not present

## 2017-08-14 DIAGNOSIS — R142 Eructation: Secondary | ICD-10-CM

## 2017-08-14 DIAGNOSIS — R490 Dysphonia: Secondary | ICD-10-CM

## 2017-08-14 DIAGNOSIS — R195 Other fecal abnormalities: Secondary | ICD-10-CM

## 2017-08-14 DIAGNOSIS — K219 Gastro-esophageal reflux disease without esophagitis: Secondary | ICD-10-CM | POA: Diagnosis not present

## 2017-08-14 DIAGNOSIS — E782 Mixed hyperlipidemia: Secondary | ICD-10-CM

## 2017-08-14 HISTORY — DX: Other fecal abnormalities: R19.5

## 2017-08-14 NOTE — Progress Notes (Signed)
Pre visit review using our clinic review tool, if applicable. No additional management support is needed unless otherwise documented below in the visit note. 

## 2017-08-14 NOTE — Progress Notes (Signed)
Chief Complaint  Patient presents with  . Follow-up   F/u  1. Belching improved on protonix bid x 1 month per ENT Dr. Tami Ribas also lump in throat sensation is improved with ppi bid  2. 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 C/o stools after eating but not diarrhea no blood this has been like this since age [age] he wonders if he has intolerance to lactose but does not want w/u for now   Review of Systems  Constitutional: Negative for weight loss.  HENT:       +hoarseness   Eyes: Negative for blurred vision.  Respiratory: Negative for shortness of breath.   Cardiovascular: Negative for chest pain.  Gastrointestinal: Negative for blood in stool.       +stools 2-3 x per day 30 min after eating not diarrhea    Skin: Negative for rash.   Past Medical History:  Diagnosis Date  . Chicken pox   . Hyperlipidemia   . Pneumonia    newborn   Past Surgical History:  Procedure Laterality Date  . ESOPHAGOGASTRODUODENOSCOPY (EGD) WITH PROPOFOL N/A 08/07/2017   Procedure: ESOPHAGOGASTRODUODENOSCOPY (EGD) WITH PROPOFOL;  Surgeon: [name], MD;  Location[facility] ENDOSCOPY;  Service: Gastroenterology;  Laterality: N/A;  . ROOT CANAL     Family History  Problem Relation Age of Onset  . Diabetes Mother   . Hypertension Mother   . Stroke Mother   . Alcohol abuse Father   . Cancer Maternal Aunt        >type   . Diabetes Paternal Grandmother   . Heart disease Paternal Grandmother   . Cancer Paternal Grandmother        ?type cancer   . Stroke Paternal Grandfather   . Asthma Brother    Social History   Socioeconomic History  . Marital status: Married    Spouse name: Not on file  . Number of children: Not on file  . Years of education: Not on file  . Highest education level: Not on file  Occupational History  . Not on file  Social Needs  . Financial resource strain:  Not on file  . Food insecurity:    Worry: Not on file    Inability: Not on file  . Transportation needs:    Medical: Not on file    Non-medical: Not on file  Tobacco Use  . Smoking status: Current Every Day Smoker    Types: Cigarettes  . Smokeless tobacco: Never Used  . Tobacco comment: smoker since age 13 on and off now 1-2 cig/day max 1/2 ppd   Substance and Sexual Activity  . Alcohol use: Yes  . Drug use: Yes    Comment: thc sometimes   . Sexual activity: Yes  Lifestyle  . Physical activity:    Days per week: Not on file    Minutes per session: Not on file  . Stress: Not on file  Relationships  . Social connections:    Talks on phone: Not on file    Gets together: Not on file    Attends religious service: Not on file    Active member of club or organization: Not on file    Attends meetings of clubs or organizations: Not on file    Relationship status: Not on file  . Intimate partner violence:    Fear of current or ex partner: Not  on file    Emotionally abused: Not on file    Physically abused: Not on file    Forced sexual activity: Not on file  Other Topics Concern  . Not on file  Social History Narrative   Married    Former Nature conservation officer    Some college Programmer, applications    2 brothers    Current Meds  Medication Sig  . Cholecalciferol 50000 units capsule Take 1 capsule (50,000 Units total) by mouth once a week.  . nicotine (NICODERM CQ - DOSED IN MG/24 HOURS) 21 mg/24hr patch Place 21 mg onto the skin daily.  . pantoprazole (PROTONIX) 40 MG tablet Take 1 tablet (40 mg total) by mouth 2 (two) times daily. In am before food   No Known Allergies Recent Results (from the past 2160 hour(s))  T4, free     Status: None   Collection Time: 08/02/17  7:58 AM  Result Value Ref Range   Free T4 1.08 0.60 - 1.60 ng/dL    Comment: Specimens from patients who are undergoing biotin therapy and /or ingesting biotin supplements may contain high levels of biotin.  The higher  biotin concentration in these specimens interferes with this Free T4 assay.  Specimens that contain high levels  of biotin may cause false high results for this Free T4 assay.  Please interpret results in light of the total clinical presentation of the patient.    TSH     Status: None   Collection Time: 08/02/17  7:58 AM  Result Value Ref Range   TSH 1.58 0.35 - 4.50 uIU/mL  Lipid panel     Status: Abnormal   Collection Time: 08/02/17  7:58 AM  Result Value Ref Range   Cholesterol 232 (H) 0 - 200 mg/dL    Comment: ATP III Classification       Desirable:  < 200 mg/dL               Borderline High:  200 - 239 mg/dL          High:  > = 240 mg/dL   Triglycerides 133.0 0.0 - 149.0 mg/dL    Comment: Normal:  <150 mg/dLBorderline High:  150 - 199 mg/dL   HDL 34.90 (L) >39.00 mg/dL   VLDL 26.6 0.0 - 40.0 mg/dL   LDL Cholesterol 171 (H) 0 - 99 mg/dL   Total CHOL/HDL Ratio 7     Comment:                Men          Women1/2 Average Risk     3.4          3.3Average Risk          5.0          4.42X Average Risk          9.6          7.13X Average Risk          15.0          11.0                       NonHDL 197.45     Comment: NOTE:  Non-HDL goal should be 30 mg/dL higher than patient's LDL goal (i.e. LDL goal of < 70 mg/dL, would have non-HDL goal of < 100 mg/dL)  Vitamin D (25 hydroxy)     Status: Abnormal   Collection Time: 08/02/17  7:58  AM  Result Value Ref Range   VITD 9.91 (L) 30.00 - 100.00 ng/mL  Hemoglobin A1c     Status: None   Collection Time: 08/02/17  7:58 AM  Result Value Ref Range   Hgb A1c MFr Bld 5.9 4.6 - 6.5 %    Comment: Glycemic Control Guidelines for People with Diabetes:Non Diabetic:  <6%Goal of Therapy: <7%Additional Action Suggested:  >8%   Hepatic function panel     Status: None   Collection Time: 08/02/17  7:58 AM  Result Value Ref Range   Total Bilirubin 0.6 0.2 - 1.2 mg/dL   Bilirubin, Direct 0.1 0.0 - 0.3 mg/dL   Alkaline Phosphatase 60 39 - 117 U/L   AST 15 0 -  37 U/L   ALT 22 0 - 53 U/L   Total Protein 6.5 6.0 - 8.3 g/dL   Albumin 4.4 3.5 - 5.2 g/dL  Measles/Mumps/Rubella Immunity     Status: None   Collection Time: 08/02/17  7:58 AM  Result Value Ref Range   Rubeola IgG >300.00 AU/mL    Comment: AU/mL            Interpretation -----            -------------- <25.00           Negative 25.00-29.99      Equivocal >29.99           Positive . A positive result indicates that the patient has antibody to measles virus. It does not differentiate  between an active or past infection. The clinical  diagnosis must be interpreted in conjunction with  clinical signs and symptoms of the patient.    Mumps IgG 65.70 AU/mL    Comment:  AU/mL           Interpretation -------         ---------------- <9.00             Negative 9.00-10.99        Equivocal >10.99            Positive A positive result indicates that the patient has  antibody to mumps virus. It does not differentiate between an  active or past infection. The clinical diagnosis must be interpreted in conjunction with clinical signs and symptoms of the patient. .    Rubella 4.64 index    Comment:     Index            Interpretation     -----            --------------       <0.90            Not consistent with Immunity     0.90-0.99        Equivocal     > or = 1.00      Consistent with Immunity  . The presence of rubella IgG antibody suggests  immunization or past or current infection with rubella virus.   Hepatitis B surface antibody     Status: Abnormal   Collection Time: 08/02/17  7:58 AM  Result Value Ref Range   Hepatitis B-Post <5 (L) > OR = 10 mIU/mL    Comment: . Patient does not have immunity to hepatitis B virus. . For additional information, please refer to http://education.questdiagnostics.com/faq/FAQ105 (This link is being provided for informational/ educational purposes only).   Surgical pathology     Status: None   Collection Time: 08/07/17 10:36 AM  Result  Value Ref  Range   SURGICAL PATHOLOGY      Surgical Pathology CASE: [id number] PATIENT: Tanna Savoy Surgical Pathology Report     SPECIMEN SUBMITTED: A. Esophagus,R/O EOE;cbx  CLINICAL HISTORY: None provided  PRE-OPERATIVE DIAGNOSIS: Gastroesophageal reflux disease; esophagitis, not specified K21.9  POST-OPERATIVE DIAGNOSIS: None provided.     DIAGNOSIS: A.  ESOPHAGUS; COLD BIOPSY: - SQUAMOUS MUCOSA WITHOUT EOSINOPHILS, DYSPLASIA OR MALIGNANCY.   GROSS DESCRIPTION: A. Labeled: Esophagus cbx rule out EOE Received: In formalin Tissue fragment(s): Multiple Size: Aggregate, 0.9 x 0.3 x 0.1 cm Description: Wispy pink fragments Entirely submitted in one cassette.    Final Diagnosis performed by Delorse Lek, MD.   Electronically signed 08/08/2017 2:41:01PM The electronic signature indicates that the named Attending Pathologist has evaluated the specimen  Technical component performed at [facility], [address] [place], [place] [address] Lab: [phone] Dir: Rush Farmer, MD, MMM  Profess ional component performed at [facility], [facility], [place], [place], [place] [address] Lab: [phone] Dir: Dellia Nims. Rubinas, MD    Objective  Body mass index is 32.75 kg/m. Wt Readings from Last 3 Encounters:  08/14/17 215 lb 6.4 oz (97.7 kg)  08/07/17 220 lb (99.8 kg)  08/01/17 214 lb 3.2 oz (97.2 kg)   Temp Readings from Last 3 Encounters:  08/14/17 98.1 F (36.7 C) (Oral)  08/07/17 (!) 96.2 F (35.7 C) (Tympanic)  07/12/17 98.5 F (36.9 C) (Oral)   BP Readings from Last 3 Encounters:  08/14/17 132/80  08/07/17 123/82  08/01/17 120/80   Pulse Readings from Last 3 Encounters:  08/14/17 80  08/07/17 64  08/01/17 83    Physical Exam  Constitutional: He is oriented to person, place, and time. Vital signs are normal. He appears well-developed and well-nourished. He is cooperative.  HENT:  Head: Normocephalic and atraumatic.   Mouth/Throat: Oropharynx is clear and moist and mucous membranes are normal.  Eyes: Pupils are equal, round, and reactive to light. Conjunctivae are normal.  Cardiovascular: Normal rate, regular rhythm and normal heart sounds.  Pulmonary/Chest: Effort normal and breath sounds normal.  Neurological: He is alert and oriented to person, place, and time. Gait normal.  Skin: Skin is warm, dry and intact.  Psychiatric: He has a normal mood and affect. His speech is normal and behavior is normal. Judgment and thought content normal. Cognition and memory are normal.  Nursing note and vitals reviewed.   Assessment   1. GERD and belching improved  2. 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 Sx's sl improved since stopped smoking  3. Vit D def  4. Loose stools 2-3 x per day after eating since age [age] consider check lactose/gluten intolerance in future  5. HLD  6. H M Plan  1. Cont ppi bid for now not longer than 2 months  EGD 08/07/17 neg  2. F/u ent advised pt to call and sch  3.50K x 6 months weekly then otc D3 5000 IU daily  4. Consider w/u gluten and lactose intolerance in future  5.  Disc exercise and healthy die tchoices  Congratulated on no smoking since last visit  Check lipid at f/u  6.  Never had flu shot  Think about pna 23  Tdap had 2012/13 check NCIR  MMR immune  Consider hep B vaccine   sch fasting labs  Declines std check     Provider: Dr. Olivia Mackie McLean-Scocuzza-Internal Medicine

## 2017-08-14 NOTE — Patient Instructions (Addendum)
Take D 3 x 6 months then buy over the counter D3 5000 IU daily  Think about hepatitis B vaccine 3 shots over 6 months  Call ENT to resch appt  4-6 months  Vitamin D Deficiency Vitamin D deficiency is when your body does not have enough vitamin D. Vitamin D is important to your body for many reasons:  It helps the body to absorb two important minerals, called calcium and phosphorus.  It plays a role in bone health.  It may help to prevent some diseases, such as diabetes and multiple sclerosis.  It plays a role in muscle function, including heart function.  You can get vitamin D by:  Eating foods that naturally contain vitamin D.  Eating or drinking milk or other dairy products that have vitamin D added to them.  Taking a vitamin D supplement or a multivitamin supplement that contains vitamin D.  Being in the sun. Your body naturally makes vitamin D when your skin is exposed to sunlight. Your body changes the sunlight into a form of the vitamin that the body can use.  If vitamin D deficiency is severe, it can cause a condition in which your bones become soft. In adults, this condition is called osteomalacia. In children, this condition is called rickets. What are the causes? Vitamin D deficiency may be caused by:  Not eating enough foods that contain vitamin D.  Not getting enough sun exposure.  Having certain digestive system diseases that make it difficult for your body to absorb vitamin D. These diseases include Crohn disease, chronic pancreatitis, and cystic fibrosis.  Having a surgery in which a part of the stomach or a part of the small intestine is removed.  Being obese.  Having chronic kidney disease or liver disease.  What increases the risk? This condition is more likely to develop in:  Older people.  People who do not spend much time outdoors.  People who live in a long-term care facility.  People who have had broken bones.  People with weak or thin bones  (osteoporosis).  People who have a disease or condition that changes how the body absorbs vitamin D.  People who have dark skin.  People who take certain medicines, such as steroid medicines or certain seizure medicines.  People who are overweight or obese.  What are the signs or symptoms? In mild cases of vitamin D deficiency, there may not be any symptoms. If the condition is severe, symptoms may include:  Bone pain.  Muscle pain.  Falling often.  Broken bones caused by a minor injury.  How is this diagnosed? This condition is usually diagnosed with a blood test. How is this treated? Treatment for this condition may depend on what caused the condition. Treatment options include:  Taking vitamin D supplements.  Taking a calcium supplement. Your health care provider will suggest what dose is best for you.  Follow these instructions at home:  Take medicines and supplements only as told by your health care provider.  Eat foods that contain vitamin D. Choices include: ? Fortified dairy products, cereals, or juices. Fortified means that vitamin D has been added to the food. Check the label on the package to be sure. ? Fatty fish, such as salmon or trout. ? Eggs. ? Oysters.  Do not use a tanning bed.  Maintain a healthy weight. Lose weight, if needed.  Keep all follow-up visits as told by your health care provider. This is important. Contact a health care provider if:  Your symptoms do not go away.  You feel like throwing up (nausea) or you throw up (vomit).  You have fewer bowel movements than usual or it is difficult for you to have a bowel movement (constipation). This information is not intended to replace advice given to you by your health care provider. Make sure you discuss any questions you have with your health care provider. Document Released: 04/04/2011 Document Revised: 06/24/2015 Document Reviewed: 05/28/2014 Elsevier Interactive Patient Education  2018  [employer].  Cholesterol Cholesterol is a white, waxy, fat-like substance that is needed by the human body in small amounts. The liver makes all the cholesterol we need. Cholesterol is carried from the liver by the blood through the blood vessels. Deposits of cholesterol (plaques) may build up on blood vessel (artery) walls. Plaques make the arteries narrower and stiffer. Cholesterol plaques increase the risk for heart attack and stroke. You cannot feel your cholesterol level even if it is very high. The only way to know that it is high is to have a blood test. Once you know your cholesterol levels, you should keep a record of the test results. Work with your health care provider to keep your levels in the desired range. What do the results mean?  Total cholesterol is a rough measure of all the cholesterol in your blood.  LDL (low-density lipoprotein) is the "bad" cholesterol. This is the type that causes plaque to build up on the artery walls. You want this level to be low.  HDL (high-density lipoprotein) is the "good" cholesterol because it cleans the arteries and carries the LDL away. You want this level to be high.  Triglycerides are fat that the body can either burn for energy or store. High levels are closely linked to heart disease. What are the desired levels of cholesterol?  Total cholesterol below 200.  LDL below 100 for people who are at risk, below 70 for people at very high risk.  HDL above 40 is good. A level of 60 or higher is considered to be protective against heart disease.  Triglycerides below 150. How can I lower my cholesterol? Diet Follow your diet program as told by your health care provider.  Choose fish or white meat chicken and [place], roasted or baked. Limit fatty cuts of red meat, fried foods, and processed meats, such as sausage and lunch meats.  Eat lots of fresh fruits and vegetables.  Choose whole grains, beans, pasta, potatoes, and cereals.  Choose  olive oil, corn oil, or canola oil, and use only small amounts.  Avoid butter, mayonnaise, shortening, or palm kernel oils.  Avoid foods with trans fats.  Drink skim or nonfat milk and eat low-fat or nonfat yogurt and cheeses. Avoid whole milk, cream, ice cream, egg yolks, and full-fat cheeses.  Healthier desserts include angel food cake, ginger snaps, animal crackers, hard candy, popsicles, and low-fat or nonfat frozen yogurt. Avoid pastries, cakes, pies, and cookies.  Exercise  Follow your exercise program as told by your health care provider. A regular program: ? Helps to decrease LDL and raise HDL. ? Helps with weight control.  Do things that increase your activity level, such as gardening, walking, and taking the stairs.  Ask your health care provider about ways that you can be more active in your daily life.  Medicine  Take over-the-counter and prescription medicines only as told by your health care provider. ? Medicine may be prescribed by your health care provider to help lower cholesterol and decrease  the risk for heart disease. This is usually done if diet and exercise have failed to bring down cholesterol levels. ? If you have several risk factors, you may need medicine even if your levels are normal.  This information is not intended to replace advice given to you by your health care provider. Make sure you discuss any questions you have with your health care provider. Document Released: 10/05/2000 Document Revised: 08/08/2015 Document Reviewed: 07/11/2015 Elsevier Interactive Patient Education  2018 [employer].   Hepatitis B Vaccine, Recombinant injection What is this medicine? HEPATITIS B VACCINE (hep uh TAHY tis B VAK seen) is a vaccine. It is used to prevent an infection with the hepatitis B virus. This medicine may be used for other purposes; ask your health care provider or pharmacist if you have questions. COMMON BRAND NAME(S): Engerix-B, Recombivax HB What  should I tell my health care provider before I take this medicine? They need to know if you have any of these conditions: -fever, infection -heart disease -hepatitis B infection -immune system problems -kidney disease -an unusual or allergic reaction to vaccines, yeast, other medicines, foods, dyes, or preservatives -pregnant or trying to get pregnant -breast-feeding How should I use this medicine? This vaccine is for injection into a muscle. It is given by a health care professional. A copy of Vaccine Information Statements will be given before each vaccination. Read this sheet carefully each time. The sheet may change frequently. Talk to your pediatrician regarding the use of this medicine in children. While this drug may be prescribed for children as young as newborn for selected conditions, precautions do apply. Overdosage: If you think you have taken too much of this medicine contact a poison control center or emergency room at once. NOTE: This medicine is only for you. Do not share this medicine with others. What if I miss a dose? It is important not to miss your dose. Call your doctor or health care professional if you are unable to keep an appointment. What may interact with this medicine? -medicines that suppress your immune function like adalimumab, anakinra, infliximab -medicines to treat cancer -steroid medicines like prednisone or cortisone This list may not describe all possible interactions. Give your health care provider a list of all the medicines, herbs, non-prescription drugs, or dietary supplements you use. Also tell them if you smoke, drink alcohol, or use illegal drugs. Some items may interact with your medicine. What should I watch for while using this medicine? See your health care provider for all shots of this vaccine as directed. You must have 3 shots of this vaccine for protection from hepatitis B infection. Tell your doctor right away if you have any serious or  unusual side effects after getting this vaccine. What side effects may I notice from receiving this medicine? Side effects that you should report to your doctor or health care professional as soon as possible: -allergic reactions like skin rash, itching or hives, swelling of the face, lips, or tongue -breathing problems -confused, irritated -fast, irregular heartbeat -flu-like syndrome -numb, tingling pain -seizures -unusually weak or tired Side effects that usually do not require medical attention (report to your doctor or health care professional if they continue or are bothersome): -diarrhea -fever -headache -loss of appetite -muscle pain -nausea -pain, redness, swelling, or irritation at site where injected -tiredness This list may not describe all possible side effects. Call your doctor for medical advice about side effects. You may report side effects to FDA at 1-800-FDA-1088. Where should  I keep my medicine? This drug is given in a hospital or clinic and will not be stored at home. NOTE: This sheet is a summary. It may not cover all possible information. If you have questions about this medicine, talk to your doctor, pharmacist, or health care provider.  2018 Elsevier/Gold Standard (2013-05-13 13:26:01)

## 2017-08-15 ENCOUNTER — Encounter: Payer: Self-pay | Admitting: Gastroenterology

## 2017-08-16 NOTE — Progress Notes (Signed)
Not in [facility]

## 2017-08-18 ENCOUNTER — Ambulatory Visit: Payer: 59 | Admitting: Speech Pathology

## 2017-08-25 ENCOUNTER — Ambulatory Visit: Payer: 59 | Attending: Unknown Physician Specialty | Admitting: Speech Pathology

## 2017-08-28 ENCOUNTER — Telehealth: Payer: Self-pay

## 2017-08-28 NOTE — Telephone Encounter (Signed)
Copied from [place] [id number]. Topic: Quick Communication - See Telephone Encounter >> Aug 28, 2017  2:47 PM Hewitt Shorts wrote: Pt has been taking cholecaleiferol  and now has been experiencing tingling when he urinates wants to know if this is a side effect of the meds or is something else  Going on   Best number [phone]

## 2017-08-30 DIAGNOSIS — R490 Dysphonia: Secondary | ICD-10-CM | POA: Diagnosis not present

## 2017-09-01 ENCOUNTER — Ambulatory Visit: Payer: 59 | Admitting: Speech Pathology

## 2017-09-02 DIAGNOSIS — N390 Urinary tract infection, site not specified: Secondary | ICD-10-CM | POA: Diagnosis not present

## 2017-09-02 DIAGNOSIS — R319 Hematuria, unspecified: Secondary | ICD-10-CM | POA: Diagnosis not present

## 2017-09-20 ENCOUNTER — Encounter: Payer: Self-pay | Admitting: Gastroenterology

## 2017-09-20 ENCOUNTER — Ambulatory Visit (INDEPENDENT_AMBULATORY_CARE_PROVIDER_SITE_OTHER): Payer: 59 | Admitting: Gastroenterology

## 2017-09-20 VITALS — BP 129/85 | HR 81 | Ht 68.0 in | Wt 216.8 lb

## 2017-09-20 DIAGNOSIS — R0989 Other specified symptoms and signs involving the circulatory and respiratory systems: Secondary | ICD-10-CM

## 2017-09-20 DIAGNOSIS — K219 Gastro-esophageal reflux disease without esophagitis: Secondary | ICD-10-CM | POA: Diagnosis not present

## 2017-09-20 DIAGNOSIS — F458 Other somatoform disorders: Secondary | ICD-10-CM | POA: Diagnosis not present

## 2017-09-20 DIAGNOSIS — R198 Other specified symptoms and signs involving the digestive system and abdomen: Secondary | ICD-10-CM

## 2017-09-20 NOTE — Progress Notes (Signed)
   [name] MD, MRCP(U.K) [address]  [place]  [place], [place] [address]  Main: [phone]  Fax: [phone]   Primary Care Physician: McLean-Scocuzza, Nino Glow, MD  Primary Gastroenterologist:  Dr. [name]   No chief complaint on file.   HPI: Christopher Small is a 42 y.o. male    Summary of history :  He is here today to follow up to his initial visit when he was seen on 08/01/17 for globus sensation ongoing for more than a month.  He has noticed that he has a lot of mucus at the back of his throat. He has a hoarse voice and has been evaluated by ENT. Denies any dysphagia, on and off heart burn . No weight loss. Quit smoking 6 weeks back . He takes Prilosec since last 4 weeks, takes it before meals. It has helped "just a bit"   Interval history   08/01/2017-  09/20/2017  08/07/17- EGD- normal - esophageal biopsies -normal   Weight  Gained 2 lbs since last visit   Changed his eating habits - no issues with swallowing at this time. D/c PPI 2 weeks back .  Current Outpatient Medications  Medication Sig Dispense Refill  . Cholecalciferol 50000 units capsule Take 1 capsule (50,000 Units total) by mouth once a week. 13 capsule 1  . nicotine (NICODERM CQ - DOSED IN MG/24 HOURS) 21 mg/24hr patch Place 21 mg onto the skin daily.    . pantoprazole (PROTONIX) 40 MG tablet Take 1 tablet (40 mg total) by mouth 2 (two) times daily. In am before food 90 tablet 0   No current facility-administered medications for this visit.     Allergies as of 09/20/2017  . (No Known Allergies)    ROS:  General: Negative for anorexia, weight loss, fever, chills, fatigue, weakness. ENT: Negative for hoarseness, difficulty swallowing , nasal congestion. CV: Negative for chest pain, angina, palpitations, dyspnea on exertion, peripheral edema.  Respiratory: Negative for dyspnea at rest, dyspnea on exertion, cough, sputum, wheezing.  GI: See history of present illness. GU:  Negative for  dysuria, hematuria, urinary incontinence, urinary frequency, nocturnal urination.  Endo: Negative for unusual weight change.    Physical Examination:   There were no vitals taken for this visit.  General: Well-nourished, well-developed in no acute distress.  Eyes: No icterus. Conjunctivae pink. Mouth: Oropharyngeal mucosa moist and pink , no lesions erythema or exudate. Lungs: Clear to auscultation bilaterally. Non-labored. Heart: Regular rate and rhythm, no murmurs rubs or gallops.  Abdomen: Bowel sounds are normal, nontender, nondistended, no hepatosplenomegaly or masses, no abdominal bruits or hernia , no rebound or guarding.   Extremities: No lower extremity edema. No clubbing or deformities. Neuro: Alert and oriented x 3.  Grossly intact. Skin: Warm and dry, no jaundice.   Psych: Alert and cooperative, normal mood and affect.   Imaging Studies: No results found.  Assessment and Plan:   BOSCO PAPARELLA is a 42 y.o. y/o male here to follow up for probable globus sensation. Does also have a history suggestive of acid reflux. Symptoms have resolved after life style changes since last visit. EGD was normal   Plan  1. D/c PPI- continue life style changes which we discussed further 2. Return to clinic if symptoms recur and esophageal manometry can be considered at that time.   Dr [name]  MD,MRCP [facility]) Follow up PRN

## 2017-10-11 DIAGNOSIS — M79602 Pain in left arm: Secondary | ICD-10-CM | POA: Diagnosis not present

## 2017-10-11 DIAGNOSIS — R51 Headache: Secondary | ICD-10-CM | POA: Diagnosis not present

## 2017-10-12 ENCOUNTER — Encounter: Payer: Self-pay | Admitting: Internal Medicine

## 2017-10-12 ENCOUNTER — Ambulatory Visit: Payer: 59 | Admitting: Internal Medicine

## 2017-10-12 VITALS — BP 126/84 | HR 84 | Temp 98.2°F | Ht 68.0 in | Wt 215.4 lb

## 2017-10-12 DIAGNOSIS — F439 Reaction to severe stress, unspecified: Secondary | ICD-10-CM

## 2017-10-12 DIAGNOSIS — F419 Anxiety disorder, unspecified: Secondary | ICD-10-CM

## 2017-10-12 DIAGNOSIS — M778 Other enthesopathies, not elsewhere classified: Secondary | ICD-10-CM

## 2017-10-12 DIAGNOSIS — M62838 Other muscle spasm: Secondary | ICD-10-CM | POA: Diagnosis not present

## 2017-10-12 DIAGNOSIS — K047 Periapical abscess without sinus: Secondary | ICD-10-CM

## 2017-10-12 DIAGNOSIS — R002 Palpitations: Secondary | ICD-10-CM

## 2017-10-12 DIAGNOSIS — R253 Fasciculation: Secondary | ICD-10-CM | POA: Diagnosis not present

## 2017-10-12 LAB — BASIC METABOLIC PANEL
BUN: 15 mg/dL (ref 6–23)
CO2: 27 mEq/L (ref 19–32)
CREATININE: 1.14 mg/dL (ref 0.40–1.50)
Calcium: 9.8 mg/dL (ref 8.4–10.5)
Chloride: 106 mEq/L (ref 96–112)
GFR: 89.66 mL/min (ref >60.00)
Glucose, Bld: 95 mg/dL (ref 70–99)
Potassium: 4.3 mEq/L (ref 3.5–5.1)
Sodium: 140 mEq/L (ref 135–145)

## 2017-10-12 LAB — MAGNESIUM: MAGNESIUM: 2.4 mg/dL (ref 1.5–2.5)

## 2017-10-12 MED ORDER — CYCLOBENZAPRINE HCL 5 MG PO TABS: 5.0000 mg | ORAL_TABLET | Freq: Every evening | ORAL | 0 refills | Status: DC | PRN

## 2017-10-12 MED ORDER — LORAZEPAM 0.5 MG PO TABS: 0.2500 mg | ORAL_TABLET | Freq: Every day | ORAL | 0 refills | Status: DC | PRN

## 2017-10-12 MED ORDER — AMOXICILLIN-POT CLAVULANATE 875-125 MG PO TABS: 1.0000 | ORAL_TABLET | Freq: Two times a day (BID) | ORAL | 0 refills | Status: DC
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
## 2017-10-12 NOTE — Progress Notes (Signed)
Pre visit review using our clinic review tool, if applicable. No additional management support is needed unless otherwise documented below in the visit note. 

## 2017-10-12 NOTE — Patient Instructions (Addendum)
Let me know when you are ready to see cardiology I dont think you need cardiology for now I think its all stress!!!   Stress and Stress Management Stress is a normal reaction to life events. It is what you feel when life demands more than you are used to or more than you can handle. Some stress can be useful. For example, the stress reaction can help you catch the last bus of the day, study for a test, or meet a deadline at work. But stress that occurs too often or for too long can cause problems. It can affect your emotional health and interfere with relationships and normal daily activities. Too much stress can weaken your immune system and increase your risk for physical illness. If you already have a medical problem, stress can make it worse. What are the causes? All sorts of life events may cause stress. An event that causes stress for one person may not be stressful for another person. Major life events commonly cause stress. These may be positive or negative. Examples include losing your job, moving into a new home, getting married, having a baby, or losing a loved one. Less obvious life events may also cause stress, especially if they occur day after day or in combination. Examples include working long hours, driving in traffic, caring for children, being in debt, or being in a difficult relationship. What are the signs or symptoms? Stress may cause emotional symptoms including, the following:  Anxiety. This is feeling worried, afraid, on edge, overwhelmed, or out of control.  Anger. This is feeling irritated or impatient.  Depression. This is feeling sad, down, helpless, or guilty.  Difficulty focusing, remembering, or making decisions.  Stress may cause physical symptoms, including the following:  Aches and pains. These may affect your head, neck, back, stomach, or other areas of your body.  Tight muscles or clenched jaw.  Low energy or trouble sleeping.  Stress may cause  unhealthy behaviors, including the following:  Eating to feel better (overeating) or skipping meals.  Sleeping too little, too much, or both.  Working too much or putting off tasks (procrastination).  Smoking, drinking alcohol, or using drugs to feel better.  How is this diagnosed? Stress is diagnosed through an assessment by your health care provider. Your health care provider will ask questions about your symptoms and any stressful life events.Your health care provider will also ask about your medical history and may order blood tests or other tests. Certain medical conditions and medicine can cause physical symptoms similar to stress. Mental illness can cause emotional symptoms and unhealthy behaviors similar to stress. Your health care provider may refer you to a mental health professional for further evaluation. How is this treated? Stress management is the recommended treatment for stress.The goals of stress management are reducing stressful life events and coping with stress in healthy ways. Techniques for reducing stressful life events include the following:  Stress identification. Self-monitor for stress and identify what causes stress for you. These skills may help you to avoid some stressful events.  Time management. Set your priorities, keep a calendar of events, and learn to say "no." These tools can help you avoid making too many commitments.  Techniques for coping with stress include the following:  Rethinking the problem. Try to think realistically about stressful events rather than ignoring them or overreacting. Try to find the positives in a stressful situation rather than focusing on the negatives.  Exercise. Physical exercise can release both physical and  emotional tension. The key is to find a form of exercise you enjoy and do it regularly.  Relaxation techniques. These relax the body and mind. Examples include yoga, meditation, tai chi, biofeedback, deep breathing,  progressive muscle relaxation, listening to music, being out in nature, journaling, and other hobbies. Again, the key is to find one or more that you enjoy and can do regularly.  Healthy lifestyle. Eat a balanced diet, get plenty of sleep, and do not smoke. Avoid using alcohol or drugs to relax.  Strong support network. Spend time with family, friends, or other people you enjoy being around.Express your feelings and talk things over with someone you trust.  Counseling or talktherapy with a mental health professional may be helpful if you are having difficulty managing stress on your own. Medicine is typically not recommended for the treatment of stress.Talk to your health care provider if you think you need medicine for symptoms of stress. Follow these instructions at home:  Keep all follow-up visits as directed by your health care provider.  Take all medicines as directed by your health care provider. Contact a health care provider if:  Your symptoms get worse or you start having new symptoms.  You feel overwhelmed by your problems and can no longer manage them on your own. Get help right away if:  You feel like hurting yourself or someone else. This information is not intended to replace advice given to you by your health care provider. Make sure you discuss any questions you have with your health care provider. Document Released: 07/06/2000 Document Revised: 06/18/2015 Document Reviewed: 09/04/2012 Elsevier Interactive Patient Education  2017 [place] Stress Reduction Mindfulness-based stress reduction (MBSR) is a program that helps people learn to practice mindfulness. Mindfulness is the practice of intentionally paying attention to the present moment. It can be learned and practiced through techniques such as education, breathing exercises, meditation, and yoga. MBSR includes several mindfulness techniques in one program. MBSR works best when you understand  the treatment, are willing to try new things, and can commit to spending time practicing what you learn. MBSR training may include learning about:  How your emotions, thoughts, and reactions affect your body.  New ways to respond to things that cause negative thoughts to start (triggers).  How to notice your thoughts and let go of them.  Practicing awareness of everyday things that you normally do without thinking.  The techniques and goals of different types of meditation.  What are the benefits of MBSR? MBSR can have many benefits, which include helping you to:  Develop self-awareness. This refers to knowing and understanding yourself.  Learn skills and attitudes that help you to participate in your own health care.  Learn new ways to care for yourself.  Be more accepting about how things are, and let things go.  Be less judgmental and approach things with an open mind.  Be patient with yourself and trust yourself more.  MBSR has also been shown to:  Reduce negative emotions, such as depression and anxiety.  Improve memory and focus.  Change how you sense and approach pain.  Boost your body's ability to fight infections.  Help you connect better with other people.  Improve your sense of well-being.  Follow these instructions at home:  Find a local in-person or online MBSR program.  Set aside some time regularly for mindfulness practice.  Find a mindfulness practice that works best for you. This may include one or more of the  following: ? Meditation. Meditation involves focusing your mind on a certain thought or activity. ? Breathing awareness exercises. These help you to stay present by focusing on your breath. ? Body scan. For this practice, you lie down and pay attention to each part of your body from head to toe. You can identify tension and soreness and intentionally relax parts of your body. ? Yoga. Yoga involves stretching and breathing, and it can improve  your ability to move and be flexible. It can also provide an experience of testing your body's limits, which can help you release stress. ? Mindful eating. This way of eating involves focusing on the taste, texture, color, and smell of each bite of food. Because this slows down eating and helps you feel full sooner, it can be an important part of a weight-loss plan.  Find a podcast or recording that provides guidance for breathing awareness, body scan, or meditation exercises. You can listen to these any time when you have a free moment to rest without distractions.  Follow your treatment plan as told by your health care provider. This may include taking regular medicines and making changes to your diet or lifestyle as recommended. How to practice mindfulness To do a basic awareness exercise:  Find a comfortable place to sit.  Pay attention to the present moment. Observe your thoughts, feelings, and surroundings just as they are.  Avoid placing judgment on yourself, your feelings, or your surroundings. Make note of any judgment that comes up, and let it go.  Your mind may wander, and that is okay. Make note of when your thoughts drift, and return your attention to the present moment.  To do basic mindfulness meditation:  Find a comfortable place to sit. This may include a stable chair or a firm floor cushion. ? Sit upright with your back straight. Let your arms fall next to your side with your hands resting on your legs. ? If sitting in a chair, rest your feet flat on the floor. ? If sitting on a cushion, cross your legs in front of you.  Keep your head in a neutral position with your chin dropped slightly. Relax your jaw and rest the tip of your tongue on the roof of your mouth. Drop your gaze to the floor. You can close your eyes if you like.  Breathe normally and pay attention to your breath. Feel the air moving in and out of your nose. Feel your belly expanding and relaxing with each  breath.  Your mind may wander, and that is okay. Make note of when your thoughts drift, and return your attention to your breath.  Avoid placing judgment on yourself, your feelings, or your surroundings. Make note of any judgment or feelings that come up, let them go, and bring your attention back to your breath.  When you are ready, lift your gaze or open your eyes. Pay attention to how your body feels after the meditation.  Where to find more information: You can find more information about MBSR from:  Your health care provider.  Community-based meditation centers or programs.  Programs offered near you.  Summary  Mindfulness-based stress reduction (MBSR) is a program that teaches you how to intentionally pay attention to the present moment. It is used with other treatments to help you cope better with daily stress, emotions, and pain.  MBSR focuses on developing self-awareness, which allows you to respond to life stress without judgment or negative emotions.  MBSR programs may involve learning  different mindfulness practices, such as breathing exercises, meditation, yoga, body scan, or mindful eating. Find a mindfulness practice that works best for you, and set aside time for it on a regular basis. This information is not intended to replace advice given to you by your health care provider. Make sure you discuss any questions you have with your health care provider. Document Released: 05/19/2016 Document Revised: 05/19/2016 Document Reviewed: 05/19/2016 Elsevier Interactive Patient Education  2018 [place].    Generalized Anxiety Disorder, Adult Generalized anxiety disorder (GAD) is a mental health disorder. People with this condition constantly worry about everyday events. Unlike normal anxiety, worry related to GAD is not triggered by a specific event. These worries also do not fade or get better with time. GAD interferes with life functions, including relationships, work,  and school. GAD can vary from mild to severe. People with severe GAD can have intense waves of anxiety with physical symptoms (panic attacks). What are the causes? The exact cause of GAD is not known. What increases the risk? This condition is more likely to develop in:  Women.  People who have a family history of anxiety disorders.  People who are very shy.  People who experience very stressful life events, such as the death of a loved one.  People who have a very stressful family environment.  What are the signs or symptoms? People with GAD often worry excessively about many things in their lives, such as their health and family. They may also be overly concerned about:  Doing well at work.  Being on time.  Natural disasters.  Friendships.  Physical symptoms of GAD include:  Fatigue.  Muscle tension or having muscle twitches.  Trembling or feeling shaky.  Being easily startled.  Feeling like your heart is pounding or racing.  Feeling out of breath or like you cannot take a deep breath.  Having trouble falling asleep or staying asleep.  Sweating.  Nausea, diarrhea, or irritable bowel syndrome (IBS).  Headaches.  Trouble concentrating or remembering facts.  Restlessness.  Irritability.  How is this diagnosed? Your health care provider can diagnose GAD based on your symptoms and medical history. You will also have a physical exam. The health care provider will ask specific questions about your symptoms, including how severe they are, when they started, and if they come and go. Your health care provider may ask you about your use of alcohol or drugs, including prescription medicines. Your health care provider may refer you to a mental health specialist for further evaluation. Your health care provider will do a thorough examination and may perform additional tests to rule out other possible causes of your symptoms. To be diagnosed with GAD, a person must have  anxiety that:  Is out of his or her control.  Affects several different aspects of his or her life, such as work and relationships.  Causes distress that makes him or her unable to take part in normal activities.  Includes at least three physical symptoms of GAD, such as restlessness, fatigue, trouble concentrating, irritability, muscle tension, or sleep problems.  Before your health care provider can confirm a diagnosis of GAD, these symptoms must be present more days than they are not, and they must last for six months or longer. How is this treated? The following therapies are usually used to treat GAD:  Medicine. Antidepressant medicine is usually prescribed for long-term daily control. Antianxiety medicines may be added in severe cases, especially when panic attacks occur.  Talk therapy (  psychotherapy). Certain types of talk therapy can be helpful in treating GAD by providing support, education, and guidance. Options include: ? Cognitive behavioral therapy (CBT). People learn coping skills and techniques to ease their anxiety. They learn to identify unrealistic or negative thoughts and behaviors and to replace them with positive ones. ? Acceptance and commitment therapy (ACT). This treatment teaches people how to be mindful as a way to cope with unwanted thoughts and feelings. ? Biofeedback. This process trains you to manage your body's response (physiological response) through breathing techniques and relaxation methods. You will work with a therapist while machines are used to monitor your physical symptoms.  Stress management techniques. These include yoga, meditation, and exercise.  A mental health specialist can help determine which treatment is best for you. Some people see improvement with one type of therapy. However, other people require a combination of therapies. Follow these instructions at home:  Take over-the-counter and prescription medicines only as told by your health  care provider.  Try to maintain a normal routine.  Try to anticipate stressful situations and allow extra time to manage them.  Practice any stress management or self-calming techniques as taught by your health care provider.  Do not punish yourself for setbacks or for not making progress.  Try to recognize your accomplishments, even if they are small.  Keep all follow-up visits as told by your health care provider. This is important. Contact a health care provider if:  Your symptoms do not get better.  Your symptoms get worse.  You have signs of depression, such as: ? A persistently sad, cranky, or irritable mood. ? Loss of enjoyment in activities that used to bring you joy. ? Change in weight or eating. ? Changes in sleeping habits. ? Avoiding friends or family members. ? Loss of energy for normal tasks. ? Feelings of guilt or worthlessness. Get help right away if:  You have serious thoughts about hurting yourself or others. If you ever feel like you may hurt yourself or others, or have thoughts about taking your own life, get help right away. You can go to your nearest emergency department or call:  Your local emergency services (911 in the [place]  A suicide crisis helpline, such as the [facility] at 419-517-1353. This is open 24 hours a day.  Summary  Generalized anxiety disorder (GAD) is a mental health disorder that involves worry that is not triggered by a specific event.  People with GAD often worry excessively about many things in their lives, such as their health and family.  GAD may cause physical symptoms such as restlessness, trouble concentrating, sleep problems, frequent sweating, nausea, diarrhea, headaches, and trembling or muscle twitching.  A mental health specialist can help determine which treatment is best for you. Some people see improvement with one type of therapy. However, other people require a combination of  therapies. This information is not intended to replace advice given to you by your health care provider. Make sure you discuss any questions you have with your health care provider. Document Released: 05/07/2012 Document Revised: 12/01/2015 Document Reviewed: 12/01/2015 Elsevier Interactive Patient Education  [employer].

## 2017-10-13 ENCOUNTER — Encounter: Payer: Self-pay | Admitting: Internal Medicine

## 2017-10-13 DIAGNOSIS — M778 Other enthesopathies, not elsewhere classified: Secondary | ICD-10-CM | POA: Insufficient documentation

## 2017-10-13 DIAGNOSIS — F439 Reaction to severe stress, unspecified: Secondary | ICD-10-CM | POA: Insufficient documentation

## 2017-10-13 DIAGNOSIS — M62838 Other muscle spasm: Secondary | ICD-10-CM | POA: Insufficient documentation

## 2017-10-13 DIAGNOSIS — F419 Anxiety disorder, unspecified: Secondary | ICD-10-CM | POA: Insufficient documentation

## 2017-10-13 HISTORY — DX: Other enthesopathies, not elsewhere classified: M77.8

## 2018-01-12 ENCOUNTER — Other Ambulatory Visit: Payer: Self-pay | Admitting: Internal Medicine

## 2018-01-12 ENCOUNTER — Ambulatory Visit: Payer: 59 | Admitting: Internal Medicine

## 2018-01-12 ENCOUNTER — Encounter: Payer: Self-pay | Admitting: Internal Medicine

## 2018-01-12 VITALS — BP 130/86 | HR 91 | Temp 98.3°F | Ht 68.0 in | Wt 220.4 lb

## 2018-01-12 DIAGNOSIS — E559 Vitamin D deficiency, unspecified: Secondary | ICD-10-CM

## 2018-01-12 DIAGNOSIS — F419 Anxiety disorder, unspecified: Secondary | ICD-10-CM

## 2018-01-12 DIAGNOSIS — Z1389 Encounter for screening for other disorder: Secondary | ICD-10-CM

## 2018-01-12 DIAGNOSIS — R490 Dysphonia: Secondary | ICD-10-CM

## 2018-01-12 DIAGNOSIS — E782 Mixed hyperlipidemia: Secondary | ICD-10-CM | POA: Diagnosis not present

## 2018-01-12 DIAGNOSIS — K219 Gastro-esophageal reflux disease without esophagitis: Secondary | ICD-10-CM

## 2018-01-12 DIAGNOSIS — F439 Reaction to severe stress, unspecified: Secondary | ICD-10-CM

## 2018-01-12 DIAGNOSIS — R079 Chest pain, unspecified: Secondary | ICD-10-CM | POA: Diagnosis not present

## 2018-01-12 MED ORDER — PANTOPRAZOLE SODIUM 40 MG PO TBEC: 40.0000 mg | DELAYED_RELEASE_TABLET | Freq: Every day | ORAL | 0 refills | Status: DC

## 2018-01-12 MED ORDER — PANTOPRAZOLE SODIUM 40 MG PO TBEC: 40.0000 mg | DELAYED_RELEASE_TABLET | Freq: Every day | ORAL | 3 refills | Status: DC

## 2018-01-12 MED ORDER — CHOLECALCIFEROL 1.25 MG (50000 UT) PO CAPS: 50000.0000 [IU] | ORAL_CAPSULE | ORAL | 1 refills | Status: DC

## 2018-01-12 NOTE — Progress Notes (Signed)
Patient not in [facility]. 

## 2018-01-12 NOTE — Progress Notes (Signed)
Chief Complaint  Patient presents with  . Follow-up   F/u  1. C/o left upper chest and mid chest burning at times increased in freq over the last week no n/v, sob, sweating a times feels like a knot in the mid chest with rest nothing tried. He notices at times after eating spagetti, with stress with work or nothing at all  2. HLD he has tried to change diet  3. Hoarseness come back still yelling at work at times but f/u ENT Dr. 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  HENT:       +hoarseness    Eyes: Negative for blurred vision.  Respiratory: Negative for shortness of breath.   Cardiovascular: Positive for chest pain.  Gastrointestinal: Negative for abdominal pain.  Skin: Negative for rash.  Neurological: Negative for headaches.  Psychiatric/Behavioral: The patient is nervous/anxious.        +stress    Past Medical History:  Diagnosis Date  . Anxiety   . Chicken pox   . Hyperlipidemia   . Pneumonia    newborn   Past Surgical History:  Procedure Laterality Date  . ESOPHAGOGASTRODUODENOSCOPY (EGD) WITH PROPOFOL N/A 08/07/2017   Procedure: ESOPHAGOGASTRODUODENOSCOPY (EGD) WITH PROPOFOL;  Surgeon: [name], MD;  Location: [facility] ENDOSCOPY;  Service: Gastroenterology;  Laterality: N/A;  . ROOT CANAL     Family History  Problem Relation Age of Onset  . Diabetes Mother   . Hypertension Mother   . Stroke Mother   . Alcohol abuse Father   . Cancer Maternal Aunt        >type   . Diabetes Paternal Grandmother   . Heart disease Paternal Grandmother   . Cancer Paternal Grandmother        ?type cancer   . Stroke Paternal Grandfather   . Asthma Brother    Social History   Socioeconomic History  . Marital status: Married    Spouse name: Not on file  . Number of children: Not on file  . Years of education: Not on file  . Highest education level: Not on file  Occupational History  .  Not on file  Social Needs  . Financial resource strain: Not on file  . Food insecurity:    Worry: Not on file    Inability: Not on file  . Transportation needs:    Medical: Not on file    Non-medical: Not on file  Tobacco Use  . Smoking status: Light Tobacco Smoker    Types: Cigarettes  . Smokeless tobacco: Never Used  . Tobacco comment: smoker since age 89 on and off now 1-2 cig/day max 1/2 ppd   Substance and Sexual Activity  . Alcohol use: Yes  . Drug use: Yes    Comment: thc sometimes   . Sexual activity: Yes  Lifestyle  . Physical activity:    Days per week: Not on file    Minutes per session: Not on file  . Stress: Not on file  Relationships  . Social connections:    Talks on phone: Not on file    Gets together: Not on file    Attends religious service: Not on file    Active member of club or organization: Not on file    Attends meetings of clubs or organizations: Not on file    Relationship status: Not on file  . Intimate partner violence:    Fear of  current or ex partner: Not on file    Emotionally abused: Not on file    Physically abused: Not on file    Forced sexual activity: Not on file  Other Topics Concern  . Not on file  Social History Narrative   Married    Former Nature conservation officer    Some college Programmer, applications    2 brothers    No outpatient medications have been marked as taking for the 01/12/18 encounter (Office Visit) with McLean-Scocuzza, Nino Glow, MD.   No Known Allergies No results found for this or any previous visit (from the past 2160 hour(s)). Objective  Body mass index is 33.51 kg/m. Wt Readings from Last 3 Encounters:  01/12/18 220 lb 6.4 oz (100 kg)  10/12/17 215 lb 6.4 oz (97.7 kg)  09/20/17 216 lb 12.8 oz (98.3 kg)   Temp Readings from Last 3 Encounters:  01/12/18 98.3 F (36.8 C) (Oral)  10/12/17 98.2 F (36.8 C) (Oral)  08/14/17 98.1 F (36.7 C) (Oral)   BP Readings from Last 3 Encounters:  01/12/18 130/86  10/12/17  126/84  09/20/17 129/85   Pulse Readings from Last 3 Encounters:  01/12/18 91  10/12/17 84  09/20/17 81    Physical Exam Vitals signs and nursing note reviewed.  Constitutional:      Appearance: Normal appearance.  HENT:     Head: Normocephalic and atraumatic.     Nose: Nose normal.     Mouth/Throat:     Mouth: Mucous membranes are moist.     Pharynx: Oropharynx is clear.     Comments: Hoarse voice on exam today  Eyes:     Conjunctiva/sclera: Conjunctivae normal.     Pupils: Pupils are equal, round, and reactive to light.  Cardiovascular:     Rate and Rhythm: Normal rate and regular rhythm.     Heart sounds: Normal heart sounds. No murmur.  Pulmonary:     Effort: Pulmonary effort is normal.     Breath sounds: Normal breath sounds.  Chest:     Chest wall: No mass.    Skin:    General: Skin is warm and dry.  Neurological:     General: No focal deficit present.     Mental Status: He is alert and oriented to person, place, and time.     Gait: Gait normal.  Psychiatric:        Attention and Perception: Attention and perception normal.        Mood and Affect: Mood and affect normal.        Speech: Speech normal.        Behavior: Behavior normal. Behavior is cooperative.        Thought Content: Thought content normal.        Cognition and Memory: Cognition and memory normal.        Judgment: Judgment normal.     Assessment   1. Chest pain r/o cardiac etiology, anxiety, GERD  2. HLD  3. Hoarseness  4. GERD stopped PPI  5. HM Plan   1. Refer to cards for w/u I.e stress test r/o cardiac etiology  Reduce stress  rec start taking protonix again avoid food triggers  2. Recheck cholesterol in 01/2018 and consider statin if still elevated  Does not qualify for aspirin ascvd risk sore 7.9% today  3. F/u eNT  4. See above protonix 40 mg qd  5.  Never had flu shot declines today Think about pna 23  Tdap had 2012/13  check [facility] MMR immune  Consider hep B  vaccine  Declines std check  Provider: Dr. Olivia Mackie McLean-Scocuzza-Internal Medicine

## 2018-01-12 NOTE — Patient Instructions (Addendum)
Try Protonix 40 mg 1x per day 30 minutes before food  D3 5000 IU daily starting 03/2018 take weekly for now We will refer you to cardiology [place]  sch labs 01/2018 recheck cholesterol     Cholesterol Cholesterol is a white, waxy, fat-like substance that is needed by the human body in small amounts. The liver makes all the cholesterol we need. Cholesterol is carried from the liver by the blood through the blood vessels. Deposits of cholesterol (plaques) may build up on blood vessel (artery) walls. Plaques make the arteries narrower and stiffer. Cholesterol plaques increase the risk for heart attack and stroke. You cannot feel your cholesterol level even if it is very high. The only way to know that it is high is to have a blood test. Once you know your cholesterol levels, you should keep a record of the test results. Work with your health care provider to keep your levels in the desired range. What do the results mean?  Total cholesterol is a rough measure of all the cholesterol in your blood.  LDL (low-density lipoprotein) is the "bad" cholesterol. This is the type that causes plaque to build up on the artery walls. You want this level to be low.  HDL (high-density lipoprotein) is the "good" cholesterol because it cleans the arteries and carries the LDL away. You want this level to be high.  Triglycerides are fat that the body can either burn for energy or store. High levels are closely linked to heart disease. What are the desired levels of cholesterol?  Total cholesterol below 200.  LDL below 100 for people who are at risk, below 70 for people at very high risk.  HDL above 40 is good. A level of 60 or higher is considered to be protective against heart disease.  Triglycerides below 150. How can I lower my cholesterol? Diet Follow your diet program as told by your health care provider.  Choose fish or white meat chicken and [place], roasted or baked. Limit fatty cuts of red meat,  fried foods, and processed meats, such as sausage and lunch meats.  Eat lots of fresh fruits and vegetables.  Choose whole grains, beans, pasta, potatoes, and cereals.  Choose olive oil, corn oil, or canola oil, and use only small amounts.  Avoid butter, mayonnaise, shortening, or palm kernel oils.  Avoid foods with trans fats.  Drink skim or nonfat milk and eat low-fat or nonfat yogurt and cheeses. Avoid whole milk, cream, ice cream, egg yolks, and full-fat cheeses.  Healthier desserts include angel food cake, ginger snaps, animal crackers, hard candy, popsicles, and low-fat or nonfat frozen yogurt. Avoid pastries, cakes, pies, and cookies.  Exercise  Follow your exercise program as told by your health care provider. A regular program: ? Helps to decrease LDL and raise HDL. ? Helps with weight control.  Do things that increase your activity level, such as gardening, walking, and taking the stairs.  Ask your health care provider about ways that you can be more active in your daily life. Medicine  Take over-the-counter and prescription medicines only as told by your health care provider. ? Medicine may be prescribed by your health care provider to help lower cholesterol and decrease the risk for heart disease. This is usually done if diet and exercise have failed to bring down cholesterol levels. ? If you have several risk factors, you may need medicine even if your levels are normal. This information is not intended to replace advice given to you  by your health care provider. Make sure you discuss any questions you have with your health care provider. Document Released: 10/05/2000 Document Revised: 08/08/2015 Document Reviewed: 07/11/2015 Elsevier Interactive Patient Education  2019 Elsevier Inc[employer]  Nonspecific Chest Pain, Adult Chest pain can be caused by many different conditions. It can be caused by a condition that is life-threatening and requires treatment right away. It can  also be caused by something that is not life-threatening. If you have chest pain, it can be hard to know the difference, so it is important to get help right away to make sure that you do not have a serious condition. Some life-threatening causes of chest pain include:  Heart attack.  A tear in the body's main blood vessel (aortic dissection).  Inflammation around your heart (pericarditis).  A problem in the lungs, such as a blood clot (pulmonary embolism) or a collapsed lung (pneumothorax). Some non life-threatening causes of chest pain include:  Heartburn.  Anxiety or stress.  Damage to the bones, muscles, and cartilage that make up your chest wall.  Pneumonia or bronchitis.  Shingles infection (varicella-zoster virus). Chest pain can feel like:  Pain or discomfort on the surface of your chest or deep in your chest.  Crushing, pressure, aching, or squeezing pain.  Burning or tingling.  Dull or sharp pain that is worse when you move, cough, or take a deep breath.  Pain or discomfort that is also felt in your back, neck, jaw, shoulder, or arm, or pain that spreads to any of these areas. Your chest pain may come and go. It may also be constant. Your health care provider will do lab tests and other studies to find the cause of your pain. Treatment will depend on the cause of your chest pain. Follow these instructions at home: Medicines  Take over-the-counter and prescription medicines only as told by your health care provider.  If you were prescribed an antibiotic, take it as told by your health care provider. Do not stop taking the antibiotic even if you start to feel better. Lifestyle   Rest as directed by your health care provider.  Do not use any products that contain nicotine or tobacco, such as cigarettes and e-cigarettes. If you need help quitting, ask your health care provider.  Do not drink alcohol.  Make healthy lifestyle choices as recommended. These may  include: ? Getting regular exercise. Ask your health care provider to suggest some activities that are safe for you. ? Eating a heart-healthy diet. This includes plenty of fresh fruits and vegetables, whole grains, low-fat (lean) protein, and low-fat dairy products. A dietitian can help you find healthy eating options. ? Maintaining a healthy weight. ? Managing any other health conditions you have, such as high blood pressure (hypertension) or diabetes. ? Reducing stress, such as with yoga or relaxation techniques. General instructions  Pay attention to any changes in your symptoms. Tell your health care provider about them or any new symptoms.  Avoid any activities that cause chest pain.  Keep all follow-up visits as told by your health care provider. This is important. This includes visits for any further testing if your chest pain does not go away. Contact a health care provider if:  Your chest pain does not go away.  You feel depressed.  You have a fever. Get help right away if:  Your chest pain gets worse.  You have a cough that gets worse, or you cough up blood.  You have severe pain in your  abdomen.  You faint.  You have sudden, unexplained chest discomfort.  You have sudden, unexplained discomfort in your arms, back, neck, or jaw.  You have shortness of breath at any time.  You suddenly start to sweat, or your skin gets clammy.  You feel nausea or you vomit.  You suddenly feel lightheaded or dizzy.  You have severe weakness, or unexplained weakness or fatigue.  Your heart begins to beat quickly, or it feels like it is skipping beats. These symptoms may represent a serious problem that is an emergency. Do not wait to see if the symptoms will go away. Get medical help right away. Call your local emergency services (911 in the [place] Do not drive yourself to the hospital. Summary  Chest pain can be caused by a condition that is serious and requires urgent  treatment. It may also be caused by something that is not life-threatening.  If you have chest pain, it is very important to see your health care provider. Your health care provider may do lab tests and other studies to find the cause of your pain.  Follow your health care provider's instructions on taking medicines, making lifestyle changes, and getting emergency treatment if symptoms become worse.  Keep all follow-up visits as told by your health care provider. This includes visits for any further testing if your chest pain does not go away. This information is not intended to replace advice given to you by your health care provider. Make sure you discuss any questions you have with your health care provider. Document Released: 10/20/2004 Document Revised: 07/13/2017 Document Reviewed: 07/13/2017 Elsevier Interactive Patient Education  2019 [place] for Gastroesophageal Reflux Disease, Adult When you have gastroesophageal reflux disease (GERD), the foods you eat and your eating habits are very important. Choosing the right foods can help ease the discomfort of GERD. Consider working with a diet and nutrition specialist (dietitian) to help you make healthy food choices. What general guidelines should I follow?  Eating plan  Choose healthy foods low in fat, such as fruits, vegetables, whole grains, low-fat dairy products, and lean meat, fish, and poultry.  Eat frequent, small meals instead of three large meals each day. Eat your meals slowly, in a relaxed setting. Avoid bending over or lying down until 2-3 hours after eating.  Limit high-fat foods such as fatty meats or fried foods.  Limit your intake of oils, butter, and shortening to less than 8 teaspoons each day.  Avoid the following: ? Foods that cause symptoms. These may be different for different people. Keep a food diary to keep track of foods that cause symptoms. ? Alcohol. ? Drinking large amounts of liquid  with meals. ? Eating meals during the 2-3 hours before bed.  Cook foods using methods other than frying. This may include baking, grilling, or broiling. Lifestyle  Maintain a healthy weight. Ask your health care provider what weight is healthy for you. If you need to lose weight, work with your health care provider to do so safely.  Exercise for at least 30 minutes on 5 or more days each week, or as told by your health care provider.  Avoid wearing clothes that fit tightly around your waist and chest.  Do not use any products that contain nicotine or tobacco, such as cigarettes and e-cigarettes. If you need help quitting, ask your health care provider.  Sleep with the head of your bed raised. Use a wedge under the mattress or blocks under  the bed frame to raise the head of the bed. What foods are not recommended? The items listed may not be a complete list. Talk with your dietitian about what dietary choices are best for you. Grains Pastries or quick breads with added fat. [place] toast. Vegetables Deep fried vegetables. [place] fries. Any vegetables prepared with added fat. Any vegetables that cause symptoms. For some people this may include tomatoes and tomato products, chili peppers, onions and garlic, and horseradish. Fruits Any fruits prepared with added fat. Any fruits that cause symptoms. For some people this may include citrus fruits, such as oranges, grapefruit, pineapple, and lemons. Meats and other protein foods High-fat meats, such as fatty beef or pork, hot dogs, ribs, ham, sausage, salami and bacon. Fried meat or protein, including fried fish and fried chicken. Nuts and nut butters. Dairy Whole milk and chocolate milk. Sour cream. Cream. Ice cream. Cream cheese. Milk shakes. Beverages Coffee and tea, with or without caffeine. Carbonated beverages. Sodas. Energy drinks. Fruit juice made with acidic fruits (such as orange or grapefruit). Tomato juice. Alcoholic drinks. Fats and  oils Butter. Margarine. Shortening. Ghee. Sweets and desserts Chocolate and cocoa. Donuts. Seasoning and other foods Pepper. Peppermint and spearmint. Any condiments, herbs, or seasonings that cause symptoms. For some people, this may include curry, hot sauce, or vinegar-based salad dressings. Summary  When you have gastroesophageal reflux disease (GERD), food and lifestyle choices are very important to help ease the discomfort of GERD.  Eat frequent, small meals instead of three large meals each day. Eat your meals slowly, in a relaxed setting. Avoid bending over or lying down until 2-3 hours after eating.  Limit high-fat foods such as fatty meat or fried foods. This information is not intended to replace advice given to you by your health care provider. Make sure you discuss any questions you have with your health care provider. Document Released: 01/10/2005 Document Revised: 01/12/2016 Document Reviewed: 01/12/2016 Elsevier Interactive Patient Education  2019 [employer].

## 2018-01-12 NOTE — Progress Notes (Signed)
Pre visit review using our clinic review tool, if applicable. No additional management support is needed unless otherwise documented below in the visit note. 

## 2018-01-12 NOTE — Telephone Encounter (Signed)
Copied from [place] [id number]. Topic: Quick Communication - Rx Refill/Question >> Jan 12, 2018  2:10 PM Leward Quan A wrote: Medication: Cholecalciferol 50000 units capsule  Has the patient contacted their pharmacy? Yes.   (Agent: If no, request that the patient contact the pharmacy for the refill.) (Agent: If yes, when and what did the pharmacy advise?)  Preferred Pharmacy (with phone number or street name): [place] [facility] ([facility] [place] - [place] [phone] (Phone) [phone] (Fax)    Agent: Please be advised that RX refills may take up to 3 business days. We ask that you follow-up with your pharmacy.

## 2018-01-12 NOTE — Telephone Encounter (Signed)
Requested medication (s) are due for refill today: yes  Requested medication (s) are on the active medication list: yes  Last refill: 08/03/17  Future visit scheduled: yes  Notes to clinic: 50,000 U strength not delegated    Requested Prescriptions  Pending Prescriptions Disp Refills   Cholecalciferol 1.25 MG (50000 UT) capsule 13 capsule 1    Sig: Take 1 capsule (50,000 Units total) by mouth once a week.     Endocrinology:  Vitamins - Vitamin D Supplementation Failed - 01/12/2018  2:35 PM      Failed - 50,000 IU strengths are not delegated      Failed - Phosphate in normal range and within 360 days    No results found for: PHOS       Failed - Vit D in normal range and within 360 days    VITD  Date Value Ref Range Status  08/02/2017 9.91 (L) 30.00 - 100.00 ng/mL Final         Passed - Ca in normal range and within 360 days    Calcium  Date Value Ref Range Status  10/12/2017 9.8 8.4 - 10.5 mg/dL Final         Passed - Valid encounter within last 12 months    Recent Outpatient Visits          Today Chest pain, unspecified type   [place] McLean-Scocuzza, Nino Glow, MD   3 months ago [place] McLean-Scocuzza, Nino Glow, MD   5 months ago Hoarseness   [place] McLean-Scocuzza, Nino Glow, MD   6 months ago Gastroesophageal reflux disease, esophagitis presence not specified   [place], Nino Glow, MD      Future Appointments            In 3 months McLean-Scocuzza, Nino Glow, MD [facility], [facility]

## 2018-01-14 ENCOUNTER — Emergency Department
Admission: EM | Admit: 2018-01-14 | Discharge: 2018-01-14 | Disposition: A | Discharge status: Home / Self Care | Payer: 59 | Attending: Emergency Medicine | Admitting: Emergency Medicine

## 2018-01-14 ENCOUNTER — Other Ambulatory Visit: Payer: Self-pay

## 2018-01-14 ENCOUNTER — Encounter: Payer: Self-pay | Admitting: Emergency Medicine

## 2018-01-14 ENCOUNTER — Emergency Department: Payer: 59

## 2018-01-14 DIAGNOSIS — M25512 Pain in left shoulder: Secondary | ICD-10-CM | POA: Diagnosis not present

## 2018-01-14 DIAGNOSIS — F1210 Cannabis abuse, uncomplicated: Secondary | ICD-10-CM | POA: Diagnosis not present

## 2018-01-14 DIAGNOSIS — R079 Chest pain, unspecified: Secondary | ICD-10-CM | POA: Insufficient documentation

## 2018-01-14 DIAGNOSIS — R05 Cough: Secondary | ICD-10-CM | POA: Diagnosis not present

## 2018-01-14 DIAGNOSIS — F17210 Nicotine dependence, cigarettes, uncomplicated: Secondary | ICD-10-CM | POA: Diagnosis not present

## 2018-01-14 DIAGNOSIS — R0602 Shortness of breath: Secondary | ICD-10-CM | POA: Diagnosis not present

## 2018-01-14 LAB — COMPREHENSIVE METABOLIC PANEL
ALBUMIN: 4.7 g/dL (ref 3.5–5.0)
ALT: 35 U/L (ref 0–44)
AST: 20 U/L (ref 15–41)
Alkaline Phosphatase: 60 U/L (ref 38–126)
Anion gap: 6 (ref 5–15)
BUN: 15 mg/dL (ref 6–20)
CO2: 24 mmol/L (ref 22–32)
Calcium: 9.5 mg/dL (ref 8.9–10.3)
Chloride: 110 mmol/L (ref 98–111)
Creatinine, Ser: 1 mg/dL (ref 0.61–1.24)
GFR calc Af Amer: >60 mL/min (ref >60)
GFR calc non Af Amer: >60 mL/min (ref >60)
GLUCOSE: 105 mg/dL — AB (ref 70–99)
Potassium: 3.9 mmol/L (ref 3.5–5.1)
SODIUM: 140 mmol/L (ref 135–145)
Total Bilirubin: 0.8 mg/dL (ref 0.3–1.2)
Total Protein: 7 g/dL (ref 6.5–8.1)

## 2018-01-14 LAB — CBC
HCT: 44 % (ref 39.0–52.0)
Hemoglobin: 14.5 g/dL (ref 13.0–17.0)
MCH: 26.8 pg (ref 26.0–34.0)
MCHC: 33 g/dL (ref 30.0–36.0)
MCV: 81.3 fL (ref 80.0–100.0)
NRBC: 0 % (ref 0.0–0.2)
PLATELETS: 325 10*3/uL (ref 150–400)
RBC: 5.41 MIL/uL (ref 4.22–5.81)
RDW: 14.6 % (ref 11.5–15.5)
WBC: 7.8 10*3/uL (ref 4.0–10.5)

## 2018-01-14 LAB — TROPONIN I: Troponin I: <0.03 ng/mL (ref <0.03)

## 2018-01-14 NOTE — ED Triage Notes (Signed)
Upper L chest pain radiating to shoulder and neck x 1 week, denies fall or injury.

## 2018-01-14 NOTE — ED Provider Notes (Addendum)
[facility] Emergency Department Provider Note  ___________________________________________   First MD Initiated Contact with Patient 01/14/18 1137     (approximate)  I have reviewed the triage vital signs and the nursing notes.   HISTORY  Chief Complaint Chest Pain   HPI Christopher Small is a 42 y.o. male with a history of anxiety as well as hyperlipidemia and GERD who has presented to the emergency department today with left sided chest pain as well as left shoulder pain that he describes as a 7 out of 10 burning which is been ongoing for a week.  He denies any worsening of the pain with movement.  Denies any worsening with exertion.  Denies any shortness of breath, nausea or vomiting.  Has a family history of heart disease but no one in their 54s or 18s.  Patient says that he smokes intermittently.  Does not report any drinking or drug use.  Says that he was seen by his primary care doctor earlier in the week for this and told to come to the emergency department for any worsening or concerning symptoms.  He says that the neck portion of the pain started over the past day and is what brought him to the emergency department.  Patient states that he recently started an antacid and has taken 1 dose. Pain is persistent.    Past Medical History:  Diagnosis Date  . Anxiety   . Chicken pox   . Hyperlipidemia   . Pneumonia    newborn    Patient Active Problem List   Diagnosis Date Noted  . Chest pain 01/12/2018  . Anxiety 10/13/2017  . Stress 10/13/2017  . Tendonitis of elbow, left 10/13/2017  . Muscle spasm 10/13/2017  . HLD (hyperlipidemia) 08/14/2017  . Vitamin D deficiency 08/14/2017  . Loose stools 08/14/2017  . Hoarseness 07/12/2017  . Gastroesophageal reflux disease 07/12/2017  . Belching 07/12/2017    Past Surgical History:  Procedure Laterality Date  . ESOPHAGOGASTRODUODENOSCOPY (EGD) WITH PROPOFOL N/A 08/07/2017   Procedure:  ESOPHAGOGASTRODUODENOSCOPY (EGD) WITH PROPOFOL;  Surgeon: [name], MD;  [facility] ENDOSCOPY;  Service: Gastroenterology;  Laterality: N/A;  . ROOT CANAL      Prior to Admission medications   Medication Sig Start Date End Date Taking? Authorizing Provider  amoxicillin-clavulanate (AUGMENTIN) 875-125 MG tablet Take 1 tablet by mouth 2 (two) times daily. With food Patient not taking: Reported on 01/12/2018 10/12/17   McLean-Scocuzza, Nino Glow, MD  Cholecalciferol 1.25 MG (50000 UT) capsule Take 1 capsule (50,000 Units total) by mouth once a week. 01/12/18   McLean-Scocuzza, Nino Glow, MD  cyclobenzaprine (FLEXERIL) 5 MG tablet Take 1 tablet (5 mg total) by mouth at bedtime as needed for muscle spasms. Patient not taking: Reported on 01/12/2018 10/12/17   McLean-Scocuzza, Nino Glow, MD  LORazepam (ATIVAN) 0.5 MG tablet Take 0.5-1 tablets (0.25-0.5 mg total) by mouth daily as needed for anxiety. Patient not taking: Reported on 01/12/2018 10/12/17   McLean-Scocuzza, Nino Glow, MD  nicotine (NICODERM CQ - DOSED IN MG/24 HOURS) 21 mg/24hr patch Place 21 mg onto the skin daily.    [provider]  pantoprazole (PROTONIX) 40 MG tablet Take 1 tablet (40 mg total) by mouth daily. In am before food 30 minutes 01/12/18   McLean-Scocuzza, Nino Glow, MD    Allergies Patient has no known allergies.  Family History  Problem Relation Age of Onset  . Diabetes Mother   . Hypertension Mother   . Stroke Mother   .  Alcohol abuse Father   . Cancer Maternal Aunt        >type   . Diabetes Paternal Grandmother   . Heart disease Paternal Grandmother   . Cancer Paternal Grandmother        ?type cancer   . Stroke Paternal Grandfather   . Asthma Brother     Social History Social History   Tobacco Use  . Smoking status: Light Tobacco Smoker    Types: Cigarettes  . Smokeless tobacco: Never Used  . Tobacco comment: smoker since age 6 on and off now 1-2 cig/day max 1/2 ppd   Substance Use Topics  .  Alcohol use: Yes  . Drug use: Yes    Comment: thc sometimes     Review of Systems  Constitutional: No fever/chills Eyes: No visual changes. ENT: No sore throat. Cardiovascular: As above  respiratory: Denies shortness of breath. Gastrointestinal: No abdominal pain.  No nausea, no vomiting.  No diarrhea.  No constipation. Genitourinary: Negative for dysuria. Musculoskeletal: Negative for back pain. Skin: Negative for rash. Neurological: Negative for headaches, focal weakness or numbness.   ____________________________________________   PHYSICAL EXAM:  VITAL SIGNS: ED Triage Vitals  Enc Vitals Group     BP 01/14/18 0938 138/83     Pulse Rate 01/14/18 0938 72     Resp 01/14/18 0938 16     Temp 01/14/18 0938 98.4 F (36.9 C)     Temp Source 01/14/18 0938 Oral     SpO2 01/14/18 0938 100 %     Weight 01/14/18 0927 220 lb (99.8 kg)     Height 01/14/18 0927 5\' 8"  (1.727 m)     Head Circumference --      Peak Flow --      Pain Score 01/14/18 0927 6     Pain Loc --      Pain Edu? --      Excl. in [place]? --     Constitutional: Alert and oriented. Well appearing and in no acute distress. Eyes: Conjunctivae are normal.  Head: Atraumatic. Nose: No congestion/rhinnorhea. Mouth/Throat: Mucous membranes are moist.  Neck: No stridor.   Cardiovascular: Normal rate, regular rhythm. Grossly normal heart sounds.  Pain is not reproducible to palpation. Respiratory: Normal respiratory effort.  No retractions. Lungs CTAB. Gastrointestinal: Soft and nontender. No distention.  Musculoskeletal: No lower extremity tenderness nor edema.  No joint effusions. Neurologic:  Normal speech and language. No gross focal neurologic deficits are appreciated. Skin:  Skin is warm, dry and intact. No rash noted. Psychiatric: Mood and affect are normal. Speech and behavior are normal.  ____________________________________________   LABS (all labs ordered are listed, but only abnormal results are  displayed)  Labs Reviewed  COMPREHENSIVE METABOLIC PANEL - Abnormal; Notable for the following components:      Result Value   Glucose, Bld 105 (*)    All other components within normal limits  CBC  TROPONIN I   ____________________________________________  EKG  ED ECG REPORT I, Doran Stabler, the attending physician, personally viewed and interpreted this ECG.   Date: 01/14/2018  EKG Time: 0933  Rate: 79  Rhythm: normal sinus rhythm  Axis: Normal  Intervals:none  ST&T Change: No ST segment elevation or depression.  No abnormal T wave inversion.  ____________________________________________  RADIOLOGY  Chest x-ray without acute process. ____________________________________________   PROCEDURES  Procedure(s) performed:   Procedures  Critical Care performed:   ____________________________________________   INITIAL IMPRESSION / ASSESSMENT AND PLAN / ED COURSE  Pertinent labs & imaging results that were available during my care of the patient were reviewed by me and considered in my medical decision making (see chart for details).  Differential diagnosis includes, but is not limited to, ACS, aortic dissection, pulmonary embolism, cardiac tamponade, pneumothorax, pneumonia, pericarditis, myocarditis, GI-related causes including esophagitis/gastritis, and musculoskeletal chest wall pain.   As part of my medical decision making, I reviewed the following data within the electronic MEDICAL RECORD NUMBER Notes from prior ED visits  ----------------------------------------- 12:54 PM on 01/14/2018 -----------------------------------------  Heart score of 0-1 PE RC negative.  Patient to be referred to cardiology.  I discussed with the patient that I do not see concerning findings that would require him to be admitted to the hospital at this time.  He will be following up with cardiology.  To return for any worsening or concerning symptoms.  He is understanding of the  diagnosis well treatment plan and willing to comply.  ____________________________________________   FINAL CLINICAL IMPRESSION(S) / ED DIAGNOSES  Chest pain.  NEW MEDICATIONS STARTED DURING THIS VISIT:  New Prescriptions   No medications on file     Note:  This document was prepared using Dragon voice recognition software and may include unintentional dictation errors.     Orbie Pyo, MD 01/14/18 1255    Azarel Banner, Randall An, MD 01/14/18 1256

## 2018-01-14 NOTE — ED Triage Notes (Signed)
Pt reports chest tightness and pressure in his left chest radiating to his left arm and face for the past week. Pt reports saw his PMD and they wanted him to see a cardiologist but he has not received that call yet.

## 2018-02-02 DIAGNOSIS — B9789 Other viral agents as the cause of diseases classified elsewhere: Secondary | ICD-10-CM | POA: Diagnosis not present

## 2018-02-02 DIAGNOSIS — J069 Acute upper respiratory infection, unspecified: Secondary | ICD-10-CM | POA: Diagnosis not present

## 2018-02-12 ENCOUNTER — Other Ambulatory Visit (INDEPENDENT_AMBULATORY_CARE_PROVIDER_SITE_OTHER): Payer: 59

## 2018-02-12 DIAGNOSIS — E782 Mixed hyperlipidemia: Secondary | ICD-10-CM | POA: Diagnosis not present

## 2018-02-12 DIAGNOSIS — E559 Vitamin D deficiency, unspecified: Secondary | ICD-10-CM

## 2018-02-12 DIAGNOSIS — Z1389 Encounter for screening for other disorder: Secondary | ICD-10-CM

## 2018-02-12 LAB — LIPID PANEL
Cholesterol: 250 mg/dL — ABNORMAL HIGH (ref 0–200)
HDL: 34.7 mg/dL — ABNORMAL LOW (ref >39.00)
LDL Cholesterol: 176 mg/dL — ABNORMAL HIGH (ref 0–99)
NonHDL: 215.46
Total CHOL/HDL Ratio: 7
Triglycerides: 197 mg/dL — ABNORMAL HIGH (ref 0.0–149.0)
VLDL: 39.4 mg/dL (ref 0.0–40.0)

## 2018-02-12 LAB — URINALYSIS, ROUTINE W REFLEX MICROSCOPIC
BILIRUBIN URINE: NEGATIVE
HGB URINE DIPSTICK: NEGATIVE
Ketones, ur: NEGATIVE
LEUKOCYTES UA: NEGATIVE
NITRITE: NEGATIVE
RBC / HPF: NONE SEEN (ref 0–?)
Specific Gravity, Urine: 1.01 (ref 1.000–1.030)
Total Protein, Urine: NEGATIVE
Urine Glucose: NEGATIVE
Urobilinogen, UA: 0.2 (ref 0.0–1.0)
pH: 6.5 (ref 5.0–8.0)

## 2018-02-12 LAB — VITAMIN D 25 HYDROXY (VIT D DEFICIENCY, FRACTURES): VITD: 41.31 ng/mL (ref 30.00–100.00)

## 2018-02-15 ENCOUNTER — Telehealth: Payer: Self-pay

## 2018-02-15 NOTE — Telephone Encounter (Signed)
Copied from [place] [id number]. Topic: General - Other >> Feb 15, 2018  1:37 PM Carolyn Stare wrote:  Pt said he saw his results and the below message on mychart and will agree on the medicine for his cholestrol         Cholesterol uncontrolled worse  -rec statin is he agreeable  [place]

## 2018-02-18 ENCOUNTER — Other Ambulatory Visit: Payer: Self-pay | Admitting: Internal Medicine

## 2018-02-18 DIAGNOSIS — E785 Hyperlipidemia, unspecified: Secondary | ICD-10-CM

## 2018-02-18 MED ORDER — ATORVASTATIN CALCIUM 20 MG PO TABS: 20.0000 mg | ORAL_TABLET | Freq: Every day | ORAL | 3 refills | Status: DC

## 2018-03-01 ENCOUNTER — Telehealth: Payer: Self-pay

## 2018-03-01 NOTE — Telephone Encounter (Signed)
Butch Penny called again to add to her previous message regarding the patient.  She stated that the patient will now be coming to [facility] on 03/05/18 and they will need the records as soon as possible.  Please cal her back with any questions at [phone]

## 2018-03-01 NOTE — Telephone Encounter (Signed)
Copied from Portal [id number]. Topic: General - Other >> Mar 01, 2018  9:44 AM Oneta Rack wrote: Osvaldo Human name: Butch Penny  Relation to pt: Referral [facility] cardiology Call back number: [phone]    Reason for call:  Awaiting medical records prior to scheduling patient. Please fax all EKGs , all records regarding why he's being referred and insurance information, please fax to [facility] cardiologist ax # [phone], phone [phone] >> Mar 01, 2018 10:05 AM Vernona Rieger wrote: Butch Penny called back and said he still does want to be seen at [facility]. He had called her earlier and said he was going to [facility] heartcare at the end of February but rather not wait that long now. She would like to know could you please cancel the referral with them and she will get him scheduled next week at [facility]. She will call back with the date.

## 2018-03-02 NOTE — Telephone Encounter (Signed)
All notes are in [facility] and also in care everywhere.

## 2018-03-05 DIAGNOSIS — Z720 Tobacco use: Secondary | ICD-10-CM | POA: Insufficient documentation

## 2018-03-05 DIAGNOSIS — E785 Hyperlipidemia, unspecified: Secondary | ICD-10-CM | POA: Diagnosis not present

## 2018-03-05 DIAGNOSIS — K219 Gastro-esophageal reflux disease without esophagitis: Secondary | ICD-10-CM | POA: Diagnosis not present

## 2018-03-05 HISTORY — DX: Tobacco use: Z72.0

## 2018-03-13 ENCOUNTER — Other Ambulatory Visit: Payer: Self-pay | Admitting: Internal Medicine

## 2018-03-13 DIAGNOSIS — F419 Anxiety disorder, unspecified: Secondary | ICD-10-CM

## 2018-03-13 MED ORDER — LORAZEPAM 0.5 MG PO TABS: 0.2500 mg | ORAL_TABLET | Freq: Every day | ORAL | 1 refills | Status: DC | PRN

## 2018-03-14 DIAGNOSIS — E785 Hyperlipidemia, unspecified: Secondary | ICD-10-CM | POA: Diagnosis not present

## 2018-03-14 DIAGNOSIS — Z720 Tobacco use: Secondary | ICD-10-CM | POA: Diagnosis not present

## 2018-03-14 DIAGNOSIS — R079 Chest pain, unspecified: Secondary | ICD-10-CM | POA: Diagnosis not present

## 2018-03-20 ENCOUNTER — Ambulatory Visit: Payer: 59 | Admitting: Cardiovascular Disease

## 2018-03-21 ENCOUNTER — Encounter: Payer: Self-pay | Admitting: Cardiovascular Disease

## 2018-03-21 ENCOUNTER — Telehealth: Payer: Self-pay | Admitting: Cardiovascular Disease

## 2018-03-21 NOTE — Telephone Encounter (Signed)
Patient calling States that he received a no show notice  Patient was scheduled by PCP and was seen sooner elsewhere PCP was to call and cancel but did not

## 2018-04-02 DIAGNOSIS — E785 Hyperlipidemia, unspecified: Secondary | ICD-10-CM | POA: Diagnosis not present

## 2018-04-02 DIAGNOSIS — K219 Gastro-esophageal reflux disease without esophagitis: Secondary | ICD-10-CM | POA: Diagnosis not present

## 2018-04-02 DIAGNOSIS — Z720 Tobacco use: Secondary | ICD-10-CM | POA: Diagnosis not present

## 2018-04-05 ENCOUNTER — Other Ambulatory Visit: Payer: Self-pay | Admitting: Internal Medicine

## 2018-04-05 DIAGNOSIS — F419 Anxiety disorder, unspecified: Secondary | ICD-10-CM

## 2018-04-05 NOTE — Telephone Encounter (Signed)
Copied from [place] [phone]. Topic: Quick Communication - See Telephone Encounter >> Apr 05, 2018  3:17 PM Ivar Drape wrote: CRM for notification. See Telephone encounter for: 04/05/18. Patient would like a refill of his LORazepam (ATIVAN) 0.5 MG tablet medication and have it sent to his preferred pharmacy [facility] on [address]. Patient does not have any medication left.

## 2018-04-09 ENCOUNTER — Other Ambulatory Visit: Payer: Self-pay | Admitting: Internal Medicine

## 2018-04-09 DIAGNOSIS — F419 Anxiety disorder, unspecified: Secondary | ICD-10-CM

## 2018-04-09 MED ORDER — LORAZEPAM 0.5 MG PO TABS: 0.2500 mg | ORAL_TABLET | Freq: Every day | ORAL | 2 refills | Status: DC | PRN

## 2018-04-13 ENCOUNTER — Other Ambulatory Visit: Payer: Self-pay

## 2018-04-13 ENCOUNTER — Ambulatory Visit: Payer: 59 | Admitting: Internal Medicine

## 2018-04-13 ENCOUNTER — Encounter: Payer: Self-pay | Admitting: Internal Medicine

## 2018-04-13 VITALS — BP 120/80 | HR 88 | Temp 98.8°F | Ht 68.0 in | Wt 219.8 lb

## 2018-04-13 DIAGNOSIS — Z0000 Encounter for general adult medical examination without abnormal findings: Secondary | ICD-10-CM | POA: Diagnosis not present

## 2018-04-13 DIAGNOSIS — M791 Myalgia, unspecified site: Secondary | ICD-10-CM

## 2018-04-13 DIAGNOSIS — E785 Hyperlipidemia, unspecified: Secondary | ICD-10-CM

## 2018-04-13 DIAGNOSIS — R490 Dysphonia: Secondary | ICD-10-CM

## 2018-04-13 DIAGNOSIS — M62838 Other muscle spasm: Secondary | ICD-10-CM

## 2018-04-13 DIAGNOSIS — F419 Anxiety disorder, unspecified: Secondary | ICD-10-CM

## 2018-04-13 DIAGNOSIS — R079 Chest pain, unspecified: Secondary | ICD-10-CM

## 2018-04-13 DIAGNOSIS — R7303 Prediabetes: Secondary | ICD-10-CM | POA: Diagnosis not present

## 2018-04-13 MED ORDER — CYCLOBENZAPRINE HCL 5 MG PO TABS: 5.0000 mg | ORAL_TABLET | Freq: Every evening | ORAL | 2 refills | Status: DC | PRN

## 2018-04-13 MED ORDER — ATORVASTATIN CALCIUM 10 MG PO TABS: 10.0000 mg | ORAL_TABLET | Freq: Every day | ORAL | 3 refills | Status: DC

## 2018-04-13 NOTE — Patient Instructions (Addendum)
F/u ENT w/in next 6 months Dr. Kriste Basque eye in [place]   Fasting labs end June, early 07/2018     Cholesterol Cholesterol is a white, waxy, fat-like substance that is needed by the human body in small amounts. The liver makes all the cholesterol we need. Cholesterol is carried from the liver by the blood through the blood vessels. Deposits of cholesterol (plaques) may build up on blood vessel (artery) walls. Plaques make the arteries narrower and stiffer. Cholesterol plaques increase the risk for heart attack and stroke. You cannot feel your cholesterol level even if it is very high. The only way to know that it is high is to have a blood test. Once you know your cholesterol levels, you should keep a record of the test results. Work with your health care provider to keep your levels in the desired range. What do the results mean?  Total cholesterol is a rough measure of all the cholesterol in your blood.  LDL (low-density lipoprotein) is the "bad" cholesterol. This is the type that causes plaque to build up on the artery walls. You want this level to be low.  HDL (high-density lipoprotein) is the "good" cholesterol because it cleans the arteries and carries the LDL away. You want this level to be high.  Triglycerides are fat that the body can either burn for energy or store. High levels are closely linked to heart disease. What are the desired levels of cholesterol?  Total cholesterol below 200.  LDL below 100 for people who are at risk, below 70 for people at very high risk.  HDL above 40 is good. A level of 60 or higher is considered to be protective against heart disease.  Triglycerides below 150. How can I lower my cholesterol? Diet Follow your diet program as told by your health care provider.  Choose fish or white meat chicken and [place], roasted or baked. Limit fatty cuts of red meat, fried foods, and processed meats, such as sausage and lunch meats.  Eat lots of  fresh fruits and vegetables.  Choose whole grains, beans, pasta, potatoes, and cereals.  Choose olive oil, corn oil, or canola oil, and use only small amounts.  Avoid butter, mayonnaise, shortening, or palm kernel oils.  Avoid foods with trans fats.  Drink skim or nonfat milk and eat low-fat or nonfat yogurt and cheeses. Avoid whole milk, cream, ice cream, egg yolks, and full-fat cheeses.  Healthier desserts include angel food cake, ginger snaps, animal crackers, hard candy, popsicles, and low-fat or nonfat frozen yogurt. Avoid pastries, cakes, pies, and cookies.  Exercise  Follow your exercise program as told by your health care provider. A regular program: ? Helps to decrease LDL and raise HDL. ? Helps with weight control.  Do things that increase your activity level, such as gardening, walking, and taking the stairs.  Ask your health care provider about ways that you can be more active in your daily life. Medicine  Take over-the-counter and prescription medicines only as told by your health care provider. ? Medicine may be prescribed by your health care provider to help lower cholesterol and decrease the risk for heart disease. This is usually done if diet and exercise have failed to bring down cholesterol levels. ? If you have several risk factors, you may need medicine even if your levels are normal. This information is not intended to replace advice given to you by your health care provider. Make sure you discuss any questions you have  with your health care provider. Document Released: 10/05/2000 Document Revised: 08/08/2015 Document Reviewed: 07/11/2015 Elsevier Interactive Patient Education  [employer].

## 2018-04-13 NOTE — Progress Notes (Signed)
Chief Complaint  Patient presents with  . Follow-up    3 mo   F/u  1. Anxiety improved has not had to take prn Ativan  2. GERD not taking protonix  3. CP resolved ETT negative 03/14/2018  4. Tobacco abuse has not been smoking x 4-5 months cigarettes  5. 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 HLD changed diet on lipitor 20 mg qhs   Review of Systems  Constitutional: Negative for weight loss.  HENT: Negative for hearing loss.   Eyes: Negative for blurred vision.  Respiratory: Negative for shortness of breath.   Cardiovascular: Negative for chest pain.  Gastrointestinal: Negative for heartburn.  Musculoskeletal: Positive for myalgias.  Skin: Negative for rash.  Psychiatric/Behavioral: The patient is not nervous/anxious.    Past Medical History:  Diagnosis Date  . Anxiety   . Chicken pox   . Hyperlipidemia   . Pneumonia    newborn   Past Surgical History:  Procedure Laterality Date  . ESOPHAGOGASTRODUODENOSCOPY (EGD) WITH PROPOFOL N/A 08/07/2017   Procedure: ESOPHAGOGASTRODUODENOSCOPY (EGD) WITH PROPOFOL;  Surgeon: [name], MD;  Location: [facility] ENDOSCOPY;  Service: Gastroenterology;  Laterality: N/A;  . ROOT CANAL     Family History  Problem Relation Age of Onset  . Diabetes Mother   . Hypertension Mother   . Stroke Mother   . Alcohol abuse Father   . Cancer Maternal Aunt        >type   . Diabetes Paternal Grandmother   . Heart disease Paternal Grandmother   . Cancer Paternal Grandmother        ?type cancer   . Stroke Paternal Grandfather   . Asthma Brother    Social History   Socioeconomic History  . Marital status: Married    Spouse name: Not on file  . Number of children: Not on file  . Years of education: Not on file  . Highest education level: Not on file  Occupational History  . Not on file   Social Needs  . Financial resource strain: Not on file  . Food insecurity:    Worry: Not on file    Inability: Not on file  . Transportation needs:    Medical: Not on file    Non-medical: Not on file  Tobacco Use  . Smoking status: Light Tobacco Smoker    Types: Cigarettes  . Smokeless tobacco: Never Used  . Tobacco comment: smoker since age 83 on and off now 1-2 cig/day max 1/2 ppd   Substance and Sexual Activity  . Alcohol use: Yes  . Drug use: Yes    Comment: thc sometimes   . Sexual activity: Yes  Lifestyle  . Physical activity:    Days per week: Not on file    Minutes per session: Not on file  . Stress: Not on file  Relationships  . Social connections:    Talks on phone: Not on file    Gets together: Not on file    Attends religious service: Not on file    Active member of club or organization: Not on file    Attends meetings of clubs or organizations: Not on file    Relationship status: Not on file  . Intimate partner violence:    Fear of current or ex partner: Not on file  Emotionally abused: Not on file    Physically abused: Not on file    Forced sexual activity: Not on file  Other Topics Concern  . Not on file  Social History Narrative   Married    Former Occupational psychologist    2 brothers    Current Meds  Medication Sig  . amoxicillin-clavulanate (AUGMENTIN) 875-125 MG tablet Take 1 tablet by mouth 2 (two) times daily. With food  . atorvastatin (LIPITOR) 20 MG tablet Take 1 tablet (20 mg total) by mouth daily at 6 PM.  . Cholecalciferol 1.25 MG (50000 UT) capsule Take 1 capsule (50,000 Units total) by mouth once a week.  . cyclobenzaprine (FLEXERIL) 5 MG tablet Take 1 tablet (5 mg total) by mouth at bedtime as needed for muscle spasms.  Marland Kitchen LORazepam (ATIVAN) 0.5 MG tablet Take 0.5-1 tablets (0.25-0.5 mg total) by mouth daily as needed for anxiety.  . nicotine (NICODERM CQ - DOSED IN MG/24 HOURS) 21 mg/24hr patch Place 21 mg  onto the skin daily.  . pantoprazole (PROTONIX) 40 MG tablet Take 1 tablet (40 mg total) by mouth daily. In am before food 30 minutes   No Known Allergies Recent Results (from the past 2160 hour(s))  CBC     Status: None   Collection Time: 01/14/18  9:48 AM  Result Value Ref Range   WBC 7.8 4.0 - 10.5 K/uL   RBC 5.41 4.22 - 5.81 MIL/uL   Hemoglobin 14.5 13.0 - 17.0 g/dL   HCT 44.0 39.0 - 52.0 %   MCV 81.3 80.0 - 100.0 fL   MCH 26.8 26.0 - 34.0 pg   MCHC 33.0 30.0 - 36.0 g/dL   RDW 14.6 11.5 - 15.5 %   Platelets 325 150 - 400 K/uL   nRBC 0.0 0.0 - 0.2 %    Comment: Performed at [facility], [address] [place], [place] [address]  Troponin I - ONCE - STAT     Status: None   Collection Time: 01/14/18  9:48 AM  Result Value Ref Range   Troponin I <0.03 <0.03 ng/mL    Comment: Performed at [facility], [address] [place], [place] [address]  Comprehensive metabolic panel     Status: Abnormal   Collection Time: 01/14/18  9:48 AM  Result Value Ref Range   Sodium 140 135 - 145 mmol/L   Potassium 3.9 3.5 - 5.1 mmol/L   Chloride 110 98 - 111 mmol/L   CO2 24 22 - 32 mmol/L   Glucose, Bld 105 (H) 70 - 99 mg/dL   BUN 15 6 - 20 mg/dL   Creatinine, Ser 1.00 0.61 - 1.24 mg/dL   Calcium 9.5 8.9 - 10.3 mg/dL   Total Protein 7.0 6.5 - 8.1 g/dL   Albumin 4.7 3.5 - 5.0 g/dL   AST 20 15 - 41 U/L   ALT 35 0 - 44 U/L   Alkaline Phosphatase 60 38 - 126 U/L   Total Bilirubin 0.8 0.3 - 1.2 mg/dL   GFR calc non Af Amer >60 >60 mL/min   GFR calc Af Amer >60 >60 mL/min   Anion gap 6 5 - 15    Comment: Performed at [facility], [address] [place], [place] [address]  Urinalysis, Routine w reflex microscopic     Status: None   Collection Time: 02/12/18  9:17 AM  Result Value Ref Range   Color, Urine YELLOW Yellow;Lt. Yellow;Straw;Dark Yellow;Amber;Green;Red;Brown  APPearance CLEAR Clear;Turbid;Slightly Cloudy;Cloudy   Specific Gravity, Urine 1.010 1.000  - 1.030   pH 6.5 5.0 - 8.0   Total Protein, Urine NEGATIVE Negative   Urine Glucose NEGATIVE Negative   Ketones, ur NEGATIVE Negative   Bilirubin Urine NEGATIVE Negative   Hgb urine dipstick NEGATIVE Negative   Urobilinogen, UA 0.2 0.0 - 1.0   Leukocytes, UA NEGATIVE Negative   Nitrite NEGATIVE Negative   WBC, UA 0-2/hpf 0-2/hpf   RBC / HPF none seen 0-2/hpf  Vitamin D (25 hydroxy)     Status: None   Collection Time: 02/12/18  9:17 AM  Result Value Ref Range   VITD 41.31 30.00 - 100.00 ng/mL  Lipid panel     Status: Abnormal   Collection Time: 02/12/18  9:17 AM  Result Value Ref Range   Cholesterol 250 (H) 0 - 200 mg/dL    Comment: ATP III Classification       Desirable:  < 200 mg/dL               Borderline High:  200 - 239 mg/dL          High:  > = 240 mg/dL   Triglycerides 197.0 (H) 0.0 - 149.0 mg/dL    Comment: Normal:  <150 mg/dLBorderline High:  150 - 199 mg/dL   HDL 34.70 (L) >39.00 mg/dL   VLDL 39.4 0.0 - 40.0 mg/dL   LDL Cholesterol 176 (H) 0 - 99 mg/dL   Total CHOL/HDL Ratio 7     Comment:                Men          Women1/2 Average Risk     3.4          3.3Average Risk          5.0          4.42X Average Risk          9.6          7.13X Average Risk          15.0          11.0                       NonHDL 215.46     Comment: NOTE:  Non-HDL goal should be 30 mg/dL higher than patient's LDL goal (i.e. LDL goal of < 70 mg/dL, would have non-HDL goal of < 100 mg/dL)   Objective  Body mass index is 33.42 kg/m. Wt Readings from Last 3 Encounters:  04/13/18 219 lb 12.8 oz (99.7 kg)  01/14/18 220 lb (99.8 kg)  01/12/18 220 lb 6.4 oz (100 kg)   Temp Readings from Last 3 Encounters:  04/13/18 98.8 F (37.1 C) (Oral)  01/14/18 98 F (36.7 C) (Oral)  01/12/18 98.3 F (36.8 C) (Oral)   BP Readings from Last 3 Encounters:  04/13/18 120/80  01/14/18 (!) 143/88  01/12/18 130/86   Pulse Readings from Last 3 Encounters:  04/13/18 88  01/14/18 75  01/12/18 91     Physical Exam Vitals signs and nursing note reviewed.  Constitutional:      Appearance: Normal appearance. He is well-developed and well-groomed.  HENT:     Head: Normocephalic and atraumatic.     Nose: Nose normal.     Mouth/Throat:     Mouth: Mucous membranes are moist.     Pharynx: Oropharynx is clear.  Eyes:  Conjunctiva/sclera: Conjunctivae normal.     Pupils: Pupils are equal, round, and reactive to light.  Cardiovascular:     Rate and Rhythm: Normal rate and regular rhythm.     Heart sounds: Normal heart sounds. No murmur.  Pulmonary:     Effort: Pulmonary effort is normal.     Breath sounds: Normal breath sounds.  Skin:    General: Skin is warm and dry.  Neurological:     General: No focal deficit present.     Mental Status: He is alert and oriented to person, place, and time. Mental status is at baseline.     Gait: Gait normal.  Psychiatric:        Attention and Perception: Attention and perception normal.        Mood and Affect: Mood and affect normal.        Speech: Speech normal.        Behavior: Behavior normal. Behavior is cooperative.        Thought Content: Thought content normal.        Cognition and Memory: Cognition and memory normal.        Judgment: Judgment normal.     Assessment   1. HLD 2. Chest pain ETT neg 03/14/2018 resolved  3. Hoarseness  4. Anxiety  5. HM 6. Muscle aches and weakness in arms after heavy lifting ? Statin related  Plan   1. lipitor 20 reduce to 10 mg qhs  CMET, Cbc, lipid, A1C, cK in future  2. Ntd monitor f/u with Dr. Ubaldo Glassing prn  3. F/u Dr. Tami Ribas in ~ 6 months  4. Prn ativan  5.  Never had flu shot declines today Tdap had 2012/13 check NCIR MMR immune  Consider hep B vaccine  Declines std check 6. Check labs 07/2018 reduce statin to 10 mg qhs  Prn Flexeril  Provider: Dr. Olivia Mackie McLean-Scocuzza-Internal Medicine

## 2018-07-06 ENCOUNTER — Telehealth: Payer: Self-pay

## 2018-07-06 NOTE — Telephone Encounter (Signed)
He can buy nasal saline and flush his nose  Try steam in shower in from a bowel of hot water with essential oil I.e eucalytus  rec allergy pill zyrtec, allegra or claritin over the counter   [place]

## 2018-07-06 NOTE — Telephone Encounter (Signed)
Copied from [place] [phone]. Topic: General - Other >> Jul 06, 2018  8:18 AM Celene Kras A wrote: Reason for CRM: Pt called requesting an OTC medication that he could take for congestion that wont combat the medications he is already taking. Pt states he saw traces of blood while blowing his nose this morning. Please advise.

## 2018-07-06 NOTE — Telephone Encounter (Signed)
Called pt and informed him of the instructions provider gave about the saline flush.  Also gave him OVC allergy medication that the provider recommended.  Pt understood.  Francella Barnett,cma

## 2018-07-24 ENCOUNTER — Other Ambulatory Visit: Payer: Self-pay

## 2018-07-24 ENCOUNTER — Other Ambulatory Visit (INDEPENDENT_AMBULATORY_CARE_PROVIDER_SITE_OTHER): Payer: 59

## 2018-07-24 DIAGNOSIS — E785 Hyperlipidemia, unspecified: Secondary | ICD-10-CM | POA: Diagnosis not present

## 2018-07-24 DIAGNOSIS — Z0000 Encounter for general adult medical examination without abnormal findings: Secondary | ICD-10-CM

## 2018-07-24 DIAGNOSIS — R7303 Prediabetes: Secondary | ICD-10-CM

## 2018-07-24 DIAGNOSIS — M791 Myalgia, unspecified site: Secondary | ICD-10-CM | POA: Diagnosis not present

## 2018-07-24 LAB — CBC WITH DIFFERENTIAL/PLATELET
Basophils Absolute: 0 10*3/uL (ref 0.0–0.1)
Basophils Relative: 0.2 % (ref 0.0–3.0)
Eosinophils Absolute: 0.2 10*3/uL (ref 0.0–0.7)
Eosinophils Relative: 2.7 % (ref 0.0–5.0)
HCT: 42.3 % (ref 39.0–52.0)
Hemoglobin: 13.9 g/dL (ref 13.0–17.0)
Lymphocytes Relative: 40.6 % (ref 12.0–46.0)
Lymphs Abs: 3.4 10*3/uL (ref 0.7–4.0)
MCHC: 32.9 g/dL (ref 30.0–36.0)
MCV: 81.2 fl (ref 78.0–100.0)
Monocytes Absolute: 0.5 10*3/uL (ref 0.1–1.0)
Monocytes Relative: 6 % (ref 3.0–12.0)
Neutro Abs: 4.3 10*3/uL (ref 1.4–7.7)
Neutrophils Relative %: 50.5 % (ref 43.0–77.0)
Platelets: 355 10*3/uL (ref 150.0–400.0)
RBC: 5.21 Mil/uL (ref 4.22–5.81)
RDW: 15.2 % (ref 11.5–15.5)
WBC: 8.4 10*3/uL (ref 4.0–10.5)

## 2018-07-24 LAB — COMPREHENSIVE METABOLIC PANEL
ALT: 32 U/L (ref 0–53)
AST: 22 U/L (ref 0–37)
Albumin: 4.9 g/dL (ref 3.5–5.2)
Alkaline Phosphatase: 77 U/L (ref 39–117)
BUN: 13 mg/dL (ref 6–23)
CO2: 24 mEq/L (ref 19–32)
Calcium: 9.8 mg/dL (ref 8.4–10.5)
Chloride: 105 mEq/L (ref 96–112)
Creatinine, Ser: 1.11 mg/dL (ref 0.40–1.50)
GFR: 86.69 mL/min (ref >60.00)
Glucose, Bld: 97 mg/dL (ref 70–99)
Potassium: 4.2 mEq/L (ref 3.5–5.1)
Sodium: 138 mEq/L (ref 135–145)
Total Bilirubin: 0.5 mg/dL (ref 0.2–1.2)
Total Protein: 6.7 g/dL (ref 6.0–8.3)

## 2018-07-24 LAB — LIPID PANEL
Cholesterol: 193 mg/dL (ref 0–200)
HDL: 36.1 mg/dL — ABNORMAL LOW (ref >39.00)
NonHDL: 157.18
Total CHOL/HDL Ratio: 5
Triglycerides: 211 mg/dL — ABNORMAL HIGH (ref 0.0–149.0)
VLDL: 42.2 mg/dL — ABNORMAL HIGH (ref 0.0–40.0)

## 2018-07-24 LAB — CK: Total CK: 629 U/L — ABNORMAL HIGH (ref 7–232)

## 2018-07-24 LAB — LDL CHOLESTEROL, DIRECT: Direct LDL: 134 mg/dL

## 2018-07-24 LAB — HEMOGLOBIN A1C: Hgb A1c MFr Bld: 6.3 % (ref 4.6–6.5)

## 2018-08-15 ENCOUNTER — Telehealth: Payer: Self-pay | Admitting: Internal Medicine

## 2018-08-15 NOTE — Telephone Encounter (Signed)
Patient calling back to notify Dr. Aundra Dubin within two weeks per her request, that his muscles spasms have pretty much gone away. He now wants to know what medication will he start back taking or if they need to be adjusted again?

## 2018-08-15 NOTE — Telephone Encounter (Signed)
rec healthy diet and exercise trial off lipitor  If cholesterol elevated in future consider a lower intensity cholesterol medication   TMS

## 2018-08-16 NOTE — Telephone Encounter (Signed)
Patient returned call- recommendations given as written per PCP- patient will try and see if he can control numbers with diet and exercise.

## 2018-08-16 NOTE — Telephone Encounter (Signed)
Unable to leave message for patient to return call back.VM box not set up. PEC may give information.

## 2018-09-26 ENCOUNTER — Telehealth: Payer: Self-pay | Admitting: Gastroenterology

## 2018-09-26 ENCOUNTER — Encounter: Payer: Self-pay | Admitting: *Deleted

## 2018-09-26 ENCOUNTER — Other Ambulatory Visit: Payer: Self-pay

## 2018-09-26 ENCOUNTER — Encounter: Payer: Self-pay | Admitting: Internal Medicine

## 2018-09-26 ENCOUNTER — Ambulatory Visit (INDEPENDENT_AMBULATORY_CARE_PROVIDER_SITE_OTHER): Payer: 59 | Admitting: Internal Medicine

## 2018-09-26 VITALS — Ht 68.0 in | Wt 219.8 lb

## 2018-09-26 DIAGNOSIS — Z125 Encounter for screening for malignant neoplasm of prostate: Secondary | ICD-10-CM

## 2018-09-26 DIAGNOSIS — R490 Dysphonia: Secondary | ICD-10-CM

## 2018-09-26 DIAGNOSIS — J309 Allergic rhinitis, unspecified: Secondary | ICD-10-CM

## 2018-09-26 DIAGNOSIS — R7303 Prediabetes: Secondary | ICD-10-CM

## 2018-09-26 DIAGNOSIS — E782 Mixed hyperlipidemia: Secondary | ICD-10-CM

## 2018-09-26 DIAGNOSIS — Z1211 Encounter for screening for malignant neoplasm of colon: Secondary | ICD-10-CM

## 2018-09-26 DIAGNOSIS — R748 Abnormal levels of other serum enzymes: Secondary | ICD-10-CM | POA: Diagnosis not present

## 2018-09-26 DIAGNOSIS — M7918 Myalgia, other site: Secondary | ICD-10-CM

## 2018-09-26 DIAGNOSIS — Z1329 Encounter for screening for other suspected endocrine disorder: Secondary | ICD-10-CM

## 2018-09-26 NOTE — Telephone Encounter (Signed)
Gastroenterology Pre-Procedure Review  Request Date: Monday 10/22/2018   [facility] Requesting Physician: Dr. Vicente Males  PATIENT REVIEW QUESTIONS: The patient responded to the following health history questions as indicated:    1. Are you having any GI issues? no 2. Do you have a personal history of Polyps? no 3. Do you have a family history of Colon Cancer or Polyps? yes (Mother  polyps) 4. Diabetes Mellitus? no 5. Joint replacements in the past 12 months?no 6. Major health problems in the past 3 months?no 7. Any artificial heart valves, MVP, or defibrillator?no    MEDICATIONS & ALLERGIES:    Patient reports the following regarding taking any anticoagulation/antiplatelet therapy:   Plavix, Coumadin, Eliquis, Xarelto, Lovenox, Pradaxa, Brilinta, or Effient? no Aspirin? yes (81 mg)  Patient confirms/reports the following medications:  Current Outpatient Medications  Medication Sig Dispense Refill  . Cholecalciferol 1.25 MG (50000 UT) capsule Take 1 capsule (50,000 Units total) by mouth once a week. 13 capsule 1  . cyclobenzaprine (FLEXERIL) 5 MG tablet Take 1 tablet (5 mg total) by mouth at bedtime as needed for muscle spasms. 30 tablet 2  . LORazepam (ATIVAN) 0.5 MG tablet Take 0.5-1 tablets (0.25-0.5 mg total) by mouth daily as needed for anxiety. (Patient not taking: Reported on 09/26/2018) 30 tablet 2  . nicotine (NICODERM CQ - DOSED IN MG/24 HOURS) 21 mg/24hr patch Place 21 mg onto the skin daily.     No current facility-administered medications for this visit.     Patient confirms/reports the following allergies:  Allergies  Allergen Reactions  . Lipitor [Atorvastatin Calcium]     Muscle weakness and pain and elevated CK    No orders of the defined types were placed in this encounter.   AUTHORIZATION INFORMATION Primary Insurance: 1D#: Group #:  Secondary Insurance: 1D#: Group #:  SCHEDULE INFORMATION: Date:  Time: Location:

## 2018-09-26 NOTE — Progress Notes (Signed)
Virtual Visit via Video Note  I connected with Christopher Small  on 09/26/18 at  8:00 AM EDT by a video enabled telemedicine application and verified that I am speaking with the correct person using two identifiers.  Location patient: work  Environmental manager or home office Persons participating in the virtual visit: patient, provider  I discussed the limitations of evaluation and management by telemedicine and the availability of in person appointments. The patient expressed understanding and agreed to proceed.   HPI: 1. HLD stopped lipitor 10 mg due to MSK pain and elevated muscle enzymes CK 629 and weakness  2. Hoarse voice better c/w increased pollen counts causing hoarse voice  3. C/o b/l shoulder pain a times and when he was on lipitor 10 mg lost strength in left arm but strength improved  Denies neck pain  4. He is interested in flu shot   ROS: See pertinent positives and negatives per HPI.  Past Medical History:  Diagnosis Date  . Anxiety   . Chicken pox   . Hyperlipidemia   . Pneumonia    newborn    Past Surgical History:  Procedure Laterality Date  . ESOPHAGOGASTRODUODENOSCOPY (EGD) WITH PROPOFOL N/A 08/07/2017   Procedure: ESOPHAGOGASTRODUODENOSCOPY (EGD) WITH PROPOFOL;  Surgeon: [name], MD;  Location: [facility] ENDOSCOPY;  Service: Gastroenterology;  Laterality: N/A;  . ROOT CANAL      Family History  Problem Relation Age of Onset  . Diabetes Mother   . Hypertension Mother   . Stroke Mother   . Alcohol abuse Father   . Cancer Maternal Aunt        >type   . Diabetes Paternal Grandmother   . Heart disease Paternal Grandmother   . Cancer Paternal Grandmother        ?type cancer   . Stroke Paternal Grandfather   . Asthma Brother     SOCIAL HX:  Married    Current Outpatient Medications:  .  Cholecalciferol 1.25 MG (50000 UT) capsule, Take 1 capsule (50,000 Units total) by mouth once a week., Disp: 13 capsule, Rfl: 1 .  cyclobenzaprine (FLEXERIL) 5 MG  tablet, Take 1 tablet (5 mg total) by mouth at bedtime as needed for muscle spasms., Disp: 30 tablet, Rfl: 2 .  LORazepam (ATIVAN) 0.5 MG tablet, Take 0.5-1 tablets (0.25-0.5 mg total) by mouth daily as needed for anxiety. (Patient not taking: Reported on 09/26/2018), Disp: 30 tablet, Rfl: 2 .  nicotine (NICODERM CQ - DOSED IN MG/24 HOURS) 21 mg/24hr patch, Place 21 mg onto the skin daily., Disp: , Rfl:   EXAM:  VITALS per patient if applicable:  GENERAL: alert, oriented, appears well and in no acute distress  HEENT: atraumatic, conjunttiva clear, no obvious abnormalities on inspection of external nose and ears  NECK: normal movements of the head and neck  LUNGS: on inspection no signs of respiratory distress, breathing rate appears normal, no obvious gross SOB, gasping or wheezing  CV: no obvious cyanosis  MS: moves all visible extremities without noticeable abnormality  PSYCH/NEURO: pleasant and cooperative, no obvious depression or anxiety, speech and thought processing grossly intact  ASSESSMENT AND PLAN:  Discussed the following assessment and plan:  Hoarseness - Plan: Ambulatory referral to ENT pt wants allergy testing for pollen   Mixed hyperlipidemia - Plan: Comprehensive metabolic panel, Lipid panel, CK off lipitor 10 mg due to side effects  Prediabetes - Plan: Hemoglobin A1c  Elevated CK - Plan: Comprehensive metabolic panel, Lipid panel  Musculoskeletal pain - Plan: CK (  Creatine Kinase) -if pain continues refer ortho/sm b/l shoulder pain   HM Never had flu shot considering may get with labs 10/25/2018  Tdap had 2012/13 check [facility] MMR immune  Consider hep B vaccine  Declines std check Colonoscopy referred  Check PSA   I discussed the assessment and treatment plan with the patient. The patient was provided an opportunity to ask questions and all were answered. The patient agreed with the plan and demonstrated an understanding of the instructions.   The  patient was advised to call back or seek an in-person evaluation if the symptoms worsen or if the condition fails to improve as anticipated.  Time spent 20 minutes  Delorise Jackson, MD

## 2018-09-28 ENCOUNTER — Other Ambulatory Visit: Payer: Self-pay

## 2018-09-28 DIAGNOSIS — Z1211 Encounter for screening for malignant neoplasm of colon: Secondary | ICD-10-CM

## 2018-09-28 MED ORDER — NA SULFATE-K SULFATE-MG SULF 17.5-3.13-1.6 GM/177ML PO SOLN: 1.0000 | Freq: Once | ORAL | 0 refills | Status: AC

## 2018-10-18 ENCOUNTER — Other Ambulatory Visit
Admission: RE | Admit: 2018-10-18 | Discharge: 2018-10-18 | Disposition: A | Discharge status: Home / Self Care | Payer: 59 | Source: Ambulatory Visit | Attending: Gastroenterology | Admitting: Gastroenterology

## 2018-10-18 ENCOUNTER — Other Ambulatory Visit: Payer: Self-pay

## 2018-10-18 DIAGNOSIS — Z01812 Encounter for preprocedural laboratory examination: Secondary | ICD-10-CM | POA: Diagnosis not present

## 2018-10-18 DIAGNOSIS — Z20828 Contact with and (suspected) exposure to other viral communicable diseases: Secondary | ICD-10-CM | POA: Insufficient documentation

## 2018-10-19 ENCOUNTER — Encounter: Payer: Self-pay | Admitting: *Deleted

## 2018-10-19 LAB — SARS CORONAVIRUS 2 (TAT 6-24 HRS): SARS Coronavirus 2: NEGATIVE

## 2018-10-22 ENCOUNTER — Other Ambulatory Visit: Payer: Self-pay

## 2018-10-22 ENCOUNTER — Ambulatory Visit: Payer: 59 | Admitting: Anesthesiology

## 2018-10-22 ENCOUNTER — Ambulatory Visit
Admission: RE | Admit: 2018-10-22 | Discharge: 2018-10-22 | Disposition: A | Discharge status: Home / Self Care | Payer: 59 | Attending: Gastroenterology | Admitting: Gastroenterology

## 2018-10-22 ENCOUNTER — Encounter
Admission: RE | Disposition: A | Discharge status: Home / Self Care | Payer: Self-pay | Source: Home / Self Care | Attending: Gastroenterology

## 2018-10-22 ENCOUNTER — Encounter: Payer: Self-pay | Admitting: *Deleted

## 2018-10-22 DIAGNOSIS — Z8371 Family history of colonic polyps: Secondary | ICD-10-CM | POA: Diagnosis not present

## 2018-10-22 DIAGNOSIS — Z825 Family history of asthma and other chronic lower respiratory diseases: Secondary | ICD-10-CM | POA: Diagnosis not present

## 2018-10-22 DIAGNOSIS — D122 Benign neoplasm of ascending colon: Secondary | ICD-10-CM | POA: Insufficient documentation

## 2018-10-22 DIAGNOSIS — F419 Anxiety disorder, unspecified: Secondary | ICD-10-CM | POA: Insufficient documentation

## 2018-10-22 DIAGNOSIS — Z809 Family history of malignant neoplasm, unspecified: Secondary | ICD-10-CM | POA: Diagnosis not present

## 2018-10-22 DIAGNOSIS — Z79899 Other long term (current) drug therapy: Secondary | ICD-10-CM | POA: Insufficient documentation

## 2018-10-22 DIAGNOSIS — K219 Gastro-esophageal reflux disease without esophagitis: Secondary | ICD-10-CM | POA: Insufficient documentation

## 2018-10-22 DIAGNOSIS — Z8249 Family history of ischemic heart disease and other diseases of the circulatory system: Secondary | ICD-10-CM | POA: Insufficient documentation

## 2018-10-22 DIAGNOSIS — Z87891 Personal history of nicotine dependence: Secondary | ICD-10-CM | POA: Diagnosis not present

## 2018-10-22 DIAGNOSIS — E785 Hyperlipidemia, unspecified: Secondary | ICD-10-CM | POA: Insufficient documentation

## 2018-10-22 DIAGNOSIS — Z833 Family history of diabetes mellitus: Secondary | ICD-10-CM | POA: Insufficient documentation

## 2018-10-22 DIAGNOSIS — Z83719 Family history of colon polyps, unspecified: Secondary | ICD-10-CM

## 2018-10-22 DIAGNOSIS — Z1211 Encounter for screening for malignant neoplasm of colon: Secondary | ICD-10-CM

## 2018-10-22 DIAGNOSIS — K635 Polyp of colon: Secondary | ICD-10-CM | POA: Diagnosis not present

## 2018-10-22 DIAGNOSIS — Z823 Family history of stroke: Secondary | ICD-10-CM | POA: Diagnosis not present

## 2018-10-22 DIAGNOSIS — Z811 Family history of alcohol abuse and dependence: Secondary | ICD-10-CM | POA: Insufficient documentation

## 2018-10-22 HISTORY — PX: COLONOSCOPY WITH PROPOFOL: SHX5780

## 2018-10-22 SURGERY — COLONOSCOPY WITH PROPOFOL
Anesthesia: General

## 2018-10-22 MED ORDER — MIDAZOLAM HCL 2 MG/2ML IJ SOLN
INTRAMUSCULAR | Status: DC | PRN
  Administered 2018-10-22: 2 mg via INTRAVENOUS

## 2018-10-22 MED ORDER — PROPOFOL 500 MG/50ML IV EMUL
INTRAVENOUS | Status: AC
  Filled 2018-10-22: qty 50

## 2018-10-22 MED ORDER — SODIUM CHLORIDE 0.9 % IV SOLN
INTRAVENOUS | Status: DC
  Administered 2018-10-22: 1000 mL via INTRAVENOUS

## 2018-10-22 MED ORDER — PROPOFOL 500 MG/50ML IV EMUL
INTRAVENOUS | Status: DC | PRN
  Administered 2018-10-22: 180 ug/kg/min via INTRAVENOUS

## 2018-10-22 MED ORDER — PROPOFOL 10 MG/ML IV BOLUS
INTRAVENOUS | Status: DC | PRN
  Administered 2018-10-22: 10 mg via INTRAVENOUS
  Administered 2018-10-22: 40 mg via INTRAVENOUS
  Administered 2018-10-22: 50 mg via INTRAVENOUS

## 2018-10-22 MED ORDER — MIDAZOLAM HCL 2 MG/2ML IJ SOLN
INTRAMUSCULAR | Status: AC
  Filled 2018-10-22: qty 2

## 2018-10-22 NOTE — Anesthesia Procedure Notes (Signed)
Date/Time: 10/22/2018 10:00 AM Performed by: Allean Found, CRNA Pre-anesthesia Checklist: Patient identified, Emergency Drugs available, Suction available, Patient being monitored and Timeout performed Placement Confirmation: positive ETCO2

## 2018-10-22 NOTE — Anesthesia Preprocedure Evaluation (Signed)
Anesthesia Evaluation  Patient identified by MRN, date of birth, ID band Patient awake    Reviewed: Allergy & Precautions, NPO status , Patient's Chart, lab work & pertinent test results  History of Anesthesia Complications Negative for: history of anesthetic complications  Airway Mallampati: III       Dental   Pulmonary neg sleep apnea, neg COPD, Not current smoker, former smoker,           Cardiovascular (-) hypertension(-) Past MI and (-) CHF (-) dysrhythmias (-) Valvular Problems/Murmurs     Neuro/Psych neg Seizures Anxiety    GI/Hepatic Neg liver ROS, GERD  Medicated and Controlled,  Endo/Other  neg diabetes  Renal/GU negative Renal ROS     Musculoskeletal   Abdominal   Peds  Hematology   Anesthesia Other Findings   Reproductive/Obstetrics                             Anesthesia Physical Anesthesia Plan  ASA: II  Anesthesia Plan: General   Post-op Pain Management:    Induction: Intravenous  PONV Risk Score and Plan: 2 and Propofol infusion and TIVA  Airway Management Planned: Nasal Cannula  Additional Equipment:   Intra-op Plan:   Post-operative Plan:   Informed Consent: I have reviewed the patients History and Physical, chart, labs and discussed the procedure including the risks, benefits and alternatives for the proposed anesthesia with the patient or authorized representative who has indicated his/her understanding and acceptance.       Plan Discussed with:   Anesthesia Plan Comments:         Anesthesia Quick Evaluation

## 2018-10-22 NOTE — Anesthesia Postprocedure Evaluation (Signed)
Anesthesia Post Note  Patient: Christopher Small  Procedure(s) Performed: COLONOSCOPY WITH PROPOFOL (N/A )  Patient location during evaluation: Endoscopy Anesthesia Type: General Level of consciousness: awake and alert Pain management: pain level controlled Vital Signs Assessment: post-procedure vital signs reviewed and stable Respiratory status: spontaneous breathing and respiratory function stable Cardiovascular status: stable Anesthetic complications: no     Last Vitals:  Vitals:   10/22/18 1023 10/22/18 1033  BP: 112/80 114/85  Pulse: 79 71  Resp: (!) 22 20  Temp: (!) 36.2 C   SpO2: 99% 100%    Last Pain:  Vitals:   10/22/18 1033  TempSrc:   PainSc: 0-No pain                 Mayukha Symmonds K

## 2018-10-22 NOTE — H&P (Signed)
[name], MD [address], [place], [place], [place], [address] [address], [place], [place], [place], [address] Phone: [phone]  Fax: [phone]  Primary Care Physician:  McLean-Scocuzza, Nino Glow, MD   Pre-Procedure History & Physical: HPI:  Christopher Small is a 43 y.o. male is here for an colonoscopy.   Past Medical History:  Diagnosis Date  . Anxiety   . Chicken pox   . Hyperlipidemia   . Pneumonia    newborn    Past Surgical History:  Procedure Laterality Date  . ESOPHAGOGASTRODUODENOSCOPY (EGD) WITH PROPOFOL N/A 08/07/2017   Procedure: ESOPHAGOGASTRODUODENOSCOPY (EGD) WITH PROPOFOL;  Surgeon: [name], MD;  Location: [facility] ENDOSCOPY;  Service: Gastroenterology;  Laterality: N/A;  . ROOT CANAL      Prior to Admission medications   Medication Sig Start Date End Date Taking? Authorizing Provider  Cholecalciferol 1.25 MG (50000 UT) capsule Take 1 capsule (50,000 Units total) by mouth once a week. 01/12/18  Yes McLean-Scocuzza, Nino Glow, MD  cyclobenzaprine (FLEXERIL) 5 MG tablet Take 1 tablet (5 mg total) by mouth at bedtime as needed for muscle spasms. 04/13/18   McLean-Scocuzza, Nino Glow, MD  LORazepam (ATIVAN) 0.5 MG tablet Take 0.5-1 tablets (0.25-0.5 mg total) by mouth daily as needed for anxiety. Patient not taking: Reported on 09/26/2018 04/09/18   McLean-Scocuzza, Nino Glow, MD  nicotine (NICODERM CQ - DOSED IN MG/24 HOURS) 21 mg/24hr patch Place 21 mg onto the skin daily.    [provider]    Allergies as of 09/28/2018 - Review Complete 09/26/2018  Allergen Reaction Noted  . Lipitor [atorvastatin calcium]  09/26/2018    Family History  Problem Relation Age of Onset  . Diabetes Mother   . Hypertension Mother   . Stroke Mother   . Alcohol abuse Father   . Cancer Maternal Aunt        >type   . Diabetes Paternal Grandmother   . Heart disease Paternal Grandmother   . Cancer Paternal Grandmother        ?type cancer   . Stroke Paternal  Grandfather   . Asthma Brother     Social History   Socioeconomic History  . Marital status: Married    Spouse name: Not on file  . Number of children: Not on file  . Years of education: Not on file  . Highest education level: Not on file  Occupational History  . Not on file  Social Needs  . Financial resource strain: Not on file  . Food insecurity    Worry: Not on file    Inability: Not on file  . Transportation needs    Medical: Not on file    Non-medical: Not on file  Tobacco Use  . Smoking status: Former Smoker    Types: Cigarettes    Quit date: 2019    Years since quitting: 1.7  . Smokeless tobacco: Never Used  . Tobacco comment: smoker since age 65 on and off now 1-2 cig/day max 1/2 ppd   Substance and Sexual Activity  . Alcohol use: Yes  . Drug use: Yes    Comment: thc sometimes   . Sexual activity: Yes  Lifestyle  . Physical activity    Days per week: Not on file    Minutes per session: Not on file  . Stress: Not on file  Relationships  . Social Herbalist on phone: Not on file    Gets together: Not on file  Attends religious service: Not on file    Active member of club or organization: Not on file    Attends meetings of clubs or organizations: Not on file    Relationship status: Not on file  . Intimate partner violence    Fear of current or ex partner: Not on file    Emotionally abused: Not on file    Physically abused: Not on file    Forced sexual activity: Not on file  Other Topics Concern  . Not on file  Social History Narrative   Married    Former Occupational psychologist    2 brothers     Review of Systems: See HPI, otherwise negative ROS  Physical Exam: BP 126/89   Pulse 75   Temp (!) 96.4 F (35.8 C) (Tympanic)   Resp 18   Ht 5' 8.25" (1.734 m)   Wt 98.4 kg   SpO2 100%   BMI 32.75 kg/m  General:   Alert,  pleasant and cooperative in NAD Head:  Normocephalic and atraumatic. Neck:  Supple;  no masses or thyromegaly. Lungs:  Clear throughout to auscultation, normal respiratory effort.    Heart:  +S1, +S2, Regular rate and rhythm, No edema. Abdomen:  Soft, nontender and nondistended. Normal bowel sounds, without guarding, and without rebound.   Neurologic:  Alert and  oriented x4;  grossly normal neurologically.  Impression/Plan: [name] is here for an colonoscopy to be performed for family history of colon polyps. Risks, benefits, limitations, and alternatives regarding  colonoscopy have been reviewed with the patient.  Questions have been answered.  All parties agreeable.   [name], MD  10/22/2018, 9:30 AM

## 2018-10-22 NOTE — Op Note (Signed)
[facility] Gastroenterology Patient Name: Christopher Small Procedure Date: 10/22/2018 9:52 AM MRN: [id number] Account #: [id number] Date of Birth: 08/16/1975 Admit Type: Outpatient Age: 43 Room: [facility] ENDO ROOM 4 Gender: Male Note Status: Finalized Procedure:            Colonoscopy Indications:          Colon cancer screening in patient at increased risk:                        Family history of 1st-degree relative with colon polyps                        before age 67 years Providers:            [name] MD, MD Medicines:            Monitored Anesthesia Care Complications:        No immediate complications. Procedure:            Pre-Anesthesia Assessment:                       - Prior to the procedure, a History and Physical was                        performed, and patient medications, allergies and                        sensitivities were reviewed. The patient's tolerance of                        previous anesthesia was reviewed.                       - The risks and benefits of the procedure and the                        sedation options and risks were discussed with the                        patient. All questions were answered and informed                        consent was obtained.                       - ASA Grade Assessment: II - A patient with mild                        systemic disease.                       After obtaining informed consent, the colonoscope was                        passed under direct vision. Throughout the procedure,                        the patient's blood pressure, pulse, and oxygen                        saturations were monitored continuously. The  Colonoscope was introduced through the anus and                        advanced to the the cecum, identified by the                        appendiceal orifice. The colonoscopy was performed with                        ease. The patient tolerated the  procedure well. The                        quality of the bowel preparation was excellent. Findings:      The perianal and digital rectal examinations were normal.      Two sessile polyps were found in the sigmoid colon and ascending colon.       The polyps were 4 to 6 mm in size. These polyps were removed with a cold       snare. Resection and retrieval were complete.      The exam was otherwise without abnormality on direct and retroflexion       views. Impression:           - Two 4 to 6 mm polyps in the sigmoid colon and in the                        ascending colon, removed with a cold snare. Resected                        and retrieved.                       - The examination was otherwise normal on direct and                        retroflexion views. Recommendation:       - Discharge patient to home (with escort).                       - Resume previous diet.                       - Continue present medications.                       - Await pathology results.                       - Repeat colonoscopy in 5 years for surveillance. Procedure Code(s):    --- Professional ---                       [phone], Colonoscopy, flexible; with removal of tumor(s),                        polyp(s), or other lesion(s) by snare technique Diagnosis Code(s):    --- Professional ---                       K63.5, Polyp of colon                       Z83.71,  Family history of colonic polyps CPT copyright 2019 American Medical Association. All rights reserved. The codes documented in this report are preliminary and upon coder review may  be revised to meet current compliance requirements. [name], MD [name] MD, MD 10/22/2018 10:19:11 AM This report has been signed electronically. Number of Addenda: 0 Note Initiated On: 10/22/2018 9:52 AM Scope Withdrawal Time: 0 hours 10 minutes 4 seconds  Total Procedure Duration: 0 hours 13 minutes 24 seconds  Estimated Blood Loss: Estimated blood loss:  none.      [facility]

## 2018-10-22 NOTE — Transfer of Care (Signed)
Immediate Anesthesia Transfer of Care Note  Patient: Christopher Small  Procedure(s) Performed: COLONOSCOPY WITH PROPOFOL (N/A )  Patient Location: PACU  Anesthesia Type:General  Level of Consciousness: sedated  Airway & Oxygen Therapy: Patient Spontanous Breathing and Patient connected to nasal cannula oxygen  Post-op Assessment: Report given to RN and Post -op Vital signs reviewed and stable  Post vital signs: Reviewed and stable  Last Vitals:  Vitals Value Taken Time  BP 112/80 10/22/18 1024  Temp 36.2 C 10/22/18 1023  Pulse 78 10/22/18 1025  Resp 21 10/22/18 1025  SpO2 100 % 10/22/18 1025  Vitals shown include unvalidated device data.  Last Pain:  Vitals:   10/22/18 1023  TempSrc: Tympanic  PainSc: 0-No pain         Complications: No apparent anesthesia complications

## 2018-10-22 NOTE — Anesthesia Post-op Follow-up Note (Signed)
Anesthesia QCDR form completed.        

## 2018-10-23 ENCOUNTER — Encounter: Payer: Self-pay | Admitting: Gastroenterology

## 2018-10-23 LAB — SURGICAL PATHOLOGY

## 2018-10-24 ENCOUNTER — Encounter: Payer: Self-pay | Admitting: Gastroenterology

## 2018-10-25 ENCOUNTER — Other Ambulatory Visit: Payer: Self-pay

## 2018-10-25 ENCOUNTER — Other Ambulatory Visit (INDEPENDENT_AMBULATORY_CARE_PROVIDER_SITE_OTHER): Payer: 59

## 2018-10-25 DIAGNOSIS — E782 Mixed hyperlipidemia: Secondary | ICD-10-CM

## 2018-10-25 DIAGNOSIS — Z1329 Encounter for screening for other suspected endocrine disorder: Secondary | ICD-10-CM

## 2018-10-25 DIAGNOSIS — R7303 Prediabetes: Secondary | ICD-10-CM

## 2018-10-25 DIAGNOSIS — M7918 Myalgia, other site: Secondary | ICD-10-CM | POA: Diagnosis not present

## 2018-10-25 DIAGNOSIS — Z23 Encounter for immunization: Secondary | ICD-10-CM | POA: Diagnosis not present

## 2018-10-25 DIAGNOSIS — Z125 Encounter for screening for malignant neoplasm of prostate: Secondary | ICD-10-CM | POA: Diagnosis not present

## 2018-10-25 DIAGNOSIS — R748 Abnormal levels of other serum enzymes: Secondary | ICD-10-CM

## 2018-10-25 LAB — COMPREHENSIVE METABOLIC PANEL
ALT: 34 U/L (ref 0–53)
AST: 17 U/L (ref 0–37)
Albumin: 5 g/dL (ref 3.5–5.2)
Alkaline Phosphatase: 63 U/L (ref 39–117)
BUN: 15 mg/dL (ref 6–23)
CO2: 24 mEq/L (ref 19–32)
Calcium: 10.2 mg/dL (ref 8.4–10.5)
Chloride: 105 mEq/L (ref 96–112)
Creatinine, Ser: 1.06 mg/dL (ref 0.40–1.50)
GFR: 91.32 mL/min (ref >60.00)
Glucose, Bld: 100 mg/dL — ABNORMAL HIGH (ref 70–99)
Potassium: 4.2 mEq/L (ref 3.5–5.1)
Sodium: 139 mEq/L (ref 135–145)
Total Bilirubin: 0.5 mg/dL (ref 0.2–1.2)
Total Protein: 6.9 g/dL (ref 6.0–8.3)

## 2018-10-25 LAB — LIPID PANEL
Cholesterol: 253 mg/dL — ABNORMAL HIGH (ref 0–200)
HDL: 38.8 mg/dL — ABNORMAL LOW (ref >39.00)
LDL Cholesterol: 184 mg/dL — ABNORMAL HIGH (ref 0–99)
NonHDL: 213.75
Total CHOL/HDL Ratio: 7
Triglycerides: 148 mg/dL (ref 0.0–149.0)
VLDL: 29.6 mg/dL (ref 0.0–40.0)

## 2018-10-25 LAB — CK: Total CK: 252 U/L — ABNORMAL HIGH (ref 7–232)

## 2018-10-25 LAB — TSH: TSH: 1.15 u[IU]/mL (ref 0.35–4.50)

## 2018-10-25 LAB — PSA: PSA: 0.78 ng/mL (ref 0.10–4.00)

## 2018-10-25 LAB — HEMOGLOBIN A1C: Hgb A1c MFr Bld: 6.1 % (ref 4.6–6.5)

## 2018-10-30 DIAGNOSIS — J381 Polyp of vocal cord and larynx: Secondary | ICD-10-CM

## 2018-10-30 DIAGNOSIS — K635 Polyp of colon: Secondary | ICD-10-CM

## 2018-10-30 DIAGNOSIS — Z8371 Family history of colonic polyps: Secondary | ICD-10-CM

## 2018-10-30 HISTORY — DX: Polyp of vocal cord and larynx: J38.1

## 2018-10-31 ENCOUNTER — Other Ambulatory Visit: Payer: Self-pay | Admitting: Internal Medicine

## 2018-10-31 DIAGNOSIS — E785 Hyperlipidemia, unspecified: Secondary | ICD-10-CM

## 2018-10-31 MED ORDER — EZETIMIBE 10 MG PO TABS: 10.0000 mg | ORAL_TABLET | Freq: Every day | ORAL | 3 refills | Status: DC

## 2018-12-08 ENCOUNTER — Encounter: Payer: Self-pay | Admitting: Emergency Medicine

## 2018-12-08 ENCOUNTER — Emergency Department
Admission: EM | Admit: 2018-12-08 | Discharge: 2018-12-08 | Disposition: A | Discharge status: Home / Self Care | Payer: 59 | Attending: Emergency Medicine | Admitting: Emergency Medicine

## 2018-12-08 ENCOUNTER — Other Ambulatory Visit: Payer: Self-pay

## 2018-12-08 ENCOUNTER — Emergency Department: Payer: 59

## 2018-12-08 DIAGNOSIS — Z87891 Personal history of nicotine dependence: Secondary | ICD-10-CM | POA: Diagnosis not present

## 2018-12-08 DIAGNOSIS — M25511 Pain in right shoulder: Secondary | ICD-10-CM | POA: Insufficient documentation

## 2018-12-08 DIAGNOSIS — Z79899 Other long term (current) drug therapy: Secondary | ICD-10-CM | POA: Diagnosis not present

## 2018-12-08 MED ORDER — KETOROLAC TROMETHAMINE 30 MG/ML IJ SOLN
30.0000 mg | Freq: Once | INTRAMUSCULAR | Status: AC
  Administered 2018-12-08: 30 mg via INTRAMUSCULAR
  Filled 2018-12-08: qty 1

## 2018-12-08 MED ORDER — MELOXICAM 15 MG PO TABS: 15.0000 mg | ORAL_TABLET | Freq: Every day | ORAL | 1 refills | Status: AC

## 2018-12-08 NOTE — ED Notes (Signed)
Pt states awoke yesterday with sore neck worse today so took ibuprofen and at 3pm took a muscle relaxer. States it seemed to help but wanted it checked out

## 2018-12-08 NOTE — ED Notes (Signed)
Patient declined discharge vital signs. 

## 2018-12-08 NOTE — ED Provider Notes (Signed)
[facility] Emergency Department Provider Note  ____________________________________________  Time seen: Approximately 6:05 PM  I have reviewed the triage vital signs and the nursing notes.   HISTORY  Chief Complaint Shoulder Pain    HPI Christopher Small is a 43 y.o. male presents to the emergency department with acute right shoulder pain that patient woke up with yesterday.  Patient states that pain is worsened with movement and stops at the elbow.  He denies chest pain, chest tightness or shortness of breath.  No falls or mechanisms of trauma.  Patient cannot recall an incident of heavy lifting.  No right upper extremity weakness.  No other alleviating measures have been attempted.        Past Medical History:  Diagnosis Date  . Anxiety   . Chicken pox   . Hyperlipidemia   . Pneumonia    newborn    Patient Active Problem List   Diagnosis Date Noted  . Family history of colonic polyps   . Polyp of colon   . Elevated CK 09/26/2018  . Prediabetes 04/13/2018  . Chest pain 01/12/2018  . Anxiety 10/13/2017  . Stress 10/13/2017  . Tendonitis of elbow, left 10/13/2017  . Muscle spasm 10/13/2017  . HLD (hyperlipidemia) 08/14/2017  . Vitamin D deficiency 08/14/2017  . Loose stools 08/14/2017  . Hoarseness 07/12/2017  . Gastroesophageal reflux disease 07/12/2017  . Belching 07/12/2017    Past Surgical History:  Procedure Laterality Date  . COLONOSCOPY WITH PROPOFOL N/A 10/22/2018   Procedure: COLONOSCOPY WITH PROPOFOL;  Surgeon: [name], MD;  Location: [facility] ENDOSCOPY;  Service: Gastroenterology;  Laterality: N/A;  . ESOPHAGOGASTRODUODENOSCOPY (EGD) WITH PROPOFOL N/A 08/07/2017   Procedure: ESOPHAGOGASTRODUODENOSCOPY (EGD) WITH PROPOFOL;  Surgeon: [name], MD;  Location: [facility] ENDOSCOPY;  Service: Gastroenterology;  Laterality: N/A;  . ROOT CANAL      Prior to Admission medications   Medication Sig Start Date End Date Taking? Authorizing  Provider  Cholecalciferol 1.25 MG (50000 UT) capsule Take 1 capsule (50,000 Units total) by mouth once a week. 01/12/18   McLean-Scocuzza, Nino Glow, MD  cyclobenzaprine (FLEXERIL) 5 MG tablet Take 1 tablet (5 mg total) by mouth at bedtime as needed for muscle spasms. 04/13/18   McLean-Scocuzza, Nino Glow, MD  ezetimibe (ZETIA) 10 MG tablet Take 1 tablet (10 mg total) by mouth daily. 10/31/18   McLean-Scocuzza, Nino Glow, MD  LORazepam (ATIVAN) 0.5 MG tablet Take 0.5-1 tablets (0.25-0.5 mg total) by mouth daily as needed for anxiety. Patient not taking: Reported on 09/26/2018 04/09/18   McLean-Scocuzza, Nino Glow, MD  meloxicam (MOBIC) 15 MG tablet Take 1 tablet (15 mg total) by mouth daily for 7 days. 12/08/18 12/15/18  Lannie Fields, PA-C  nicotine (NICODERM CQ - DOSED IN MG/24 HOURS) 21 mg/24hr patch Place 21 mg onto the skin daily.    [provider]    Allergies Lipitor [atorvastatin calcium]  Family History  Problem Relation Age of Onset  . Diabetes Mother   . Hypertension Mother   . Stroke Mother   . Alcohol abuse Father   . Cancer Maternal Aunt        >type   . Diabetes Paternal Grandmother   . Heart disease Paternal Grandmother   . Cancer Paternal Grandmother        ?type cancer   . Stroke Paternal Grandfather   . Asthma Brother     Social History Social History   Tobacco Use  . Smoking status: Former Smoker  Types: Cigarettes    Quit date: 2019    Years since quitting: 1.8  . Smokeless tobacco: Never Used  . Tobacco comment: smoker since age 32 on and off now 1-2 cig/day max 1/2 ppd   Substance Use Topics  . Alcohol use: Yes  . Drug use: Yes    Comment: thc sometimes      Review of Systems  Constitutional: No fever/chills Eyes: No visual changes. No discharge ENT: No upper respiratory complaints. Cardiovascular: no chest pain. Respiratory: no cough. No SOB. Gastrointestinal: No abdominal pain.  No nausea, no vomiting.  No diarrhea.  No  constipation. Musculoskeletal: Patient has right shoulder pain.  Skin: Negative for rash, abrasions, lacerations, ecchymosis. Neurological: Negative for headaches, focal weakness or numbness.   ____________________________________________   PHYSICAL EXAM:  VITAL SIGNS: ED Triage Vitals  Enc Vitals Group     BP 12/08/18 1712 (!) 145/90     Pulse Rate 12/08/18 1712 85     Resp 12/08/18 1712 20     Temp 12/08/18 1712 99.3 F (37.4 C)     Temp Source 12/08/18 1712 Oral     SpO2 12/08/18 1712 100 %     Weight 12/08/18 1710 215 lb (97.5 kg)     Height 12/08/18 1710 5\' 8"  (1.727 m)     Head Circumference --      Peak Flow --      Pain Score 12/08/18 1710 8     Pain Loc --      Pain Edu? --      Excl. in [place]? --      Constitutional: Alert and oriented. Well appearing and in no acute distress. Eyes: Conjunctivae are normal. PERRL. EOMI. Head: Atraumatic. ENT: Cardiovascular: Normal rate, regular rhythm. Normal S1 and S2.  Good peripheral circulation. Respiratory: Normal respiratory effort without tachypnea or retractions. Lungs CTAB. Good air entry to the bases with no decreased or absent breath sounds. Musculoskeletal: Patient demonstrates full range of motion at the right shoulder.  He has tenderness to palpation over the bicipital groove.  He also has tenderness elicited with resisted internal rotation at the right shoulder.  Palpable radial pulse, right. Neurologic:  Normal speech and language. No gross focal neurologic deficits are appreciated.  Skin:  Skin is warm, dry and intact. No rash noted. Psychiatric: Mood and affect are normal. Speech and behavior are normal. Patient exhibits appropriate insight and judgement.   ____________________________________________   LABS (all labs ordered are listed, but only abnormal results are displayed)  Labs Reviewed - No data to  display ____________________________________________  EKG   ____________________________________________  RADIOLOGY I personally viewed and evaluated these images as part of my medical decision making, as well as reviewing the written report by the radiologist.    Dg Shoulder Right  Result Date: 12/08/2018 CLINICAL DATA:  Right shoulder pain, no known injury EXAM: RIGHT SHOULDER - 2+ VIEW COMPARISON:  None. FINDINGS: No fracture or dislocation of the right shoulder. Joint spaces are preserved. The partially imaged right chest is unremarkable. IMPRESSION: No fracture or dislocation of the right shoulder. Joint spaces are preserved. Electronically Signed   By: Eddie Candle M.D.   On: 12/08/2018 18:44    ____________________________________________    PROCEDURES  Procedure(s) performed:    Procedures    Medications  ketorolac (TORADOL) 30 MG/ML injection 30 mg (30 mg Intramuscular Given 12/08/18 1812)     ____________________________________________   INITIAL IMPRESSION / ASSESSMENT AND PLAN / ED COURSE  Pertinent labs &  imaging results that were available during my care of the patient were reviewed by me and considered in my medical decision making (see chart for details).  Review of the [place] CSRS was performed in accordance of the [place] prior to dispensing any controlled drugs.           Assessment and Plan: Shoulder pain:  43 year old male presents to the ED with acute right shoulder pain.  Differential diagnosis includes rotator cuff tendinitis or biceps tendinitis.  Patient was given an injection of Toradol in the emergency department.  He was discharged with meloxicam.  X-ray examination of the right shoulder reveals no bony abnormality.  Return precautions were given.  All patient questions were answered.  ____________________________________________  FINAL CLINICAL IMPRESSION(S) / ED DIAGNOSES  Final diagnoses:  Acute pain of right shoulder      NEW  MEDICATIONS STARTED DURING THIS VISIT:  ED Discharge Orders         Ordered    meloxicam (MOBIC) 15 MG tablet  Daily     12/08/18 1905              This chart was dictated using voice recognition software/Dragon. Despite best efforts to proofread, errors can occur which can change the meaning. Any change was purely unintentional.    Lannie Fields, PA-C 12/08/18 1950    Nena Polio, MD 12/08/18 2308

## 2018-12-08 NOTE — ED Triage Notes (Addendum)
Pt c/o right shoulder pain and arm pain worse today than last night.

## 2018-12-26 ENCOUNTER — Encounter: Payer: Self-pay | Admitting: Internal Medicine

## 2018-12-26 ENCOUNTER — Telehealth: Payer: Self-pay | Admitting: Internal Medicine

## 2018-12-26 ENCOUNTER — Other Ambulatory Visit: Payer: Self-pay | Admitting: Internal Medicine

## 2018-12-26 NOTE — Telephone Encounter (Signed)
Patient Has been notified he will contact Dr Rockey Situ for appointment

## 2018-12-26 NOTE — Telephone Encounter (Signed)
Pt states he is having body aches due to cholesterol due to the medication of ezetimibe (ZETIA) 10 MG tablet. Pt went ED for body aches and xrays was done nothing came back. Pt was told the pain could have came from the cholesterol medication. Pt started taking a vitamin for cholesterol. Please advise and Thank you!  Call pt @ [phone].

## 2018-12-26 NOTE — Telephone Encounter (Signed)
Sorry about this stop medication zetia listed under allergies With cholesterol being so high will need to discuss other options for cholesterol treatment with cardiology Mail cholesterol healthy diet choices  Handout    MTS

## 2018-12-31 NOTE — Telephone Encounter (Signed)
Sydnee Levans, MD  [address]  [place], [place] [address]  [phone]  [phone] ([address]    [address] [place], [place]  [place], [place] [address]  [phone]  [phone] (Fax)  Gastroesophageal reflux disease, esophagitis presence not specified (Primary Dx);  Hyperlipidemia, unspecified hyperlipidemia type;  Tobacco abuse;  Anxiety;  Chest pain, unspecified type     He is established with Dr. Ubaldo Glassing  Must call them  Advised he failed zetia and lipitor for his cholesterol  Consider repatha with cardiology -would cardiology be willing to consider this for this patient?    [place]

## 2019-01-01 NOTE — Telephone Encounter (Signed)
Patient has been notified

## 2019-01-17 ENCOUNTER — Ambulatory Visit: Payer: 59 | Admitting: Internal Medicine

## 2019-01-22 ENCOUNTER — Other Ambulatory Visit: Payer: Self-pay

## 2019-01-22 ENCOUNTER — Ambulatory Visit (INDEPENDENT_AMBULATORY_CARE_PROVIDER_SITE_OTHER): Payer: 59 | Admitting: Internal Medicine

## 2019-01-22 ENCOUNTER — Encounter: Payer: Self-pay | Admitting: Internal Medicine

## 2019-01-22 VITALS — Ht 68.0 in | Wt 215.0 lb

## 2019-01-22 DIAGNOSIS — Z0000 Encounter for general adult medical examination without abnormal findings: Secondary | ICD-10-CM

## 2019-01-22 DIAGNOSIS — E782 Mixed hyperlipidemia: Secondary | ICD-10-CM | POA: Diagnosis not present

## 2019-01-22 DIAGNOSIS — E559 Vitamin D deficiency, unspecified: Secondary | ICD-10-CM | POA: Diagnosis not present

## 2019-01-22 DIAGNOSIS — R7303 Prediabetes: Secondary | ICD-10-CM

## 2019-01-22 DIAGNOSIS — R748 Abnormal levels of other serum enzymes: Secondary | ICD-10-CM | POA: Diagnosis not present

## 2019-01-22 DIAGNOSIS — Z1389 Encounter for screening for other disorder: Secondary | ICD-10-CM

## 2019-01-22 NOTE — Patient Instructions (Addendum)
Shoulder Exercises Ask your health care provider which exercises are safe for you. Do exercises exactly as told by your health care provider and adjust them as directed. It is normal to feel mild stretching, pulling, tightness, or discomfort as you do these exercises. Stop right away if you feel sudden pain or your pain gets worse. Do not begin these exercises until told by your health care provider. Stretching exercises External rotation and abduction This exercise is sometimes called corner stretch. This exercise rotates your arm outward (external rotation) and moves your arm out from your body (abduction). 1. Stand in a doorway with one of your feet slightly in front of the other. This is called a staggered stance. If you cannot reach your forearms to the door frame, stand facing a corner of a room. 2. Choose one of the following positions as told by your health care provider: ? Place your hands and forearms on the door frame above your head. ? Place your hands and forearms on the door frame at the height of your head. ? Place your hands on the door frame at the height of your elbows. 3. Slowly move your weight onto your front foot until you feel a stretch across your chest and in the front of your shoulders. Keep your head and chest upright and keep your abdominal muscles tight. 4. Hold for __________ seconds. 5. To release the stretch, shift your weight to your back foot. Repeat __________ times. Complete this exercise __________ times a day. Extension, standing 1. Stand and hold a broomstick, a cane, or a similar object behind your back. ? Your hands should be a little wider than shoulder width apart. ? Your palms should face away from your back. 2. Keeping your elbows straight and your shoulder muscles relaxed, move the stick away from your body until you feel a stretch in your shoulders (extension). ? Avoid shrugging your shoulders while you move the stick. Keep your shoulder blades tucked  down toward the middle of your back. 3. Hold for __________ seconds. 4. Slowly return to the starting position. Repeat __________ times. Complete this exercise __________ times a day. Range-of-motion exercises Pendulum  1. Stand near a wall or a surface that you can hold onto for balance. 2. Bend at the waist and let your left / right arm hang straight down. Use your other arm to support you. Keep your back straight and do not lock your knees. 3. Relax your left / right arm and shoulder muscles, and move your hips and your trunk so your left / right arm swings freely. Your arm should swing because of the motion of your body, not because you are using your arm or shoulder muscles. 4. Keep moving your hips and trunk so your arm swings in the following directions, as told by your health care provider: ? Side to side. ? Forward and backward. ? In clockwise and counterclockwise circles. 5. Continue each motion for __________ seconds, or for as long as told by your health care provider. 6. Slowly return to the starting position. Repeat __________ times. Complete this exercise __________ times a day. Shoulder flexion, standing  1. Stand and hold a broomstick, a cane, or a similar object. Place your hands a little more than shoulder width apart on the object. Your left / right hand should be palm up, and your other hand should be palm down. 2. Keep your elbow straight and your shoulder muscles relaxed. Push the stick up with your healthy arm to   raise your left / right arm in front of your body, and then over your head until you feel a stretch in your shoulder (flexion). ? Avoid shrugging your shoulder while you raise your arm. Keep your shoulder blade tucked down toward the middle of your back. 3. Hold for __________ seconds. 4. Slowly return to the starting position. Repeat __________ times. Complete this exercise __________ times a day. Shoulder abduction, standing 1. Stand and hold a broomstick,  a cane, or a similar object. Place your hands a little more than shoulder width apart on the object. Your left / right hand should be palm up, and your other hand should be palm down. 2. Keep your elbow straight and your shoulder muscles relaxed. Push the object across your body toward your left / right side. Raise your left / right arm to the side of your body (abduction) until you feel a stretch in your shoulder. ? Do not raise your arm above shoulder height unless your health care provider tells you to do that. ? If directed, raise your arm over your head. ? Avoid shrugging your shoulder while you raise your arm. Keep your shoulder blade tucked down toward the middle of your back. 3. Hold for __________ seconds. 4. Slowly return to the starting position. Repeat __________ times. Complete this exercise __________ times a day. Internal rotation  1. Place your left / right hand behind your back, palm up. 2. Use your other hand to dangle an exercise band, a towel, or a similar object over your shoulder. Grasp the band with your left / right hand so you are holding on to both ends. 3. Gently pull up on the band until you feel a stretch in the front of your left / right shoulder. The movement of your arm toward the center of your body is called internal rotation. ? Avoid shrugging your shoulder while you raise your arm. Keep your shoulder blade tucked down toward the middle of your back. 4. Hold for __________ seconds. 5. Release the stretch by letting go of the band and lowering your hands. Repeat __________ times. Complete this exercise __________ times a day. Strengthening exercises External rotation  1. Sit in a stable chair without armrests. 2. Secure an exercise band to a stable object at elbow height on your left / right side. 3. Place a soft object, such as a folded towel or a small pillow, between your left / right upper arm and your body to move your elbow about 4 inches (10 cm) away  from your side. 4. Hold the end of the exercise band so it is tight and there is no slack. 5. Keeping your elbow pressed against the soft object, slowly move your forearm out, away from your abdomen (external rotation). Keep your body steady so only your forearm moves. 6. Hold for __________ seconds. 7. Slowly return to the starting position. Repeat __________ times. Complete this exercise __________ times a day. Shoulder abduction  1. Sit in a stable chair without armrests, or stand up. 2. Hold a __________ weight in your left / right hand, or hold an exercise band with both hands. 3. Start with your arms straight down and your left / right palm facing in, toward your body. 4. Slowly lift your left / right hand out to your side (abduction). Do not lift your hand above shoulder height unless your health care provider tells you that this is safe. ? Keep your arms straight. ? Avoid shrugging your shoulder while you   do this movement. Keep your shoulder blade tucked down toward the middle of your back. 5. Hold for __________ seconds. 6. Slowly lower your arm, and return to the starting position. Repeat __________ times. Complete this exercise __________ times a day. Shoulder extension 1. Sit in a stable chair without armrests, or stand up. 2. Secure an exercise band to a stable object in front of you so it is at shoulder height. 3. Hold one end of the exercise band in each hand. Your palms should face each other. 4. Straighten your elbows and lift your hands up to shoulder height. 5. Step back, away from the secured end of the exercise band, until the band is tight and there is no slack. 6. Squeeze your shoulder blades together as you pull your hands down to the sides of your thighs (extension). Stop when your hands are straight down by your sides. Do not let your hands go behind your body. 7. Hold for __________ seconds. 8. Slowly return to the starting position. Repeat __________ times.  Complete this exercise __________ times a day. Shoulder row 1. Sit in a stable chair without armrests, or stand up. 2. Secure an exercise band to a stable object in front of you so it is at waist height. 3. Hold one end of the exercise band in each hand. Position your palms so that your thumbs are facing the ceiling (neutral position). 4. Bend each of your elbows to a 90-degree angle (right angle) and keep your upper arms at your sides. 5. Step back until the band is tight and there is no slack. 6. Slowly pull your elbows back behind you. 7. Hold for __________ seconds. 8. Slowly return to the starting position. Repeat __________ times. Complete this exercise __________ times a day. Shoulder press-ups  1. Sit in a stable chair that has armrests. Sit upright, with your feet flat on the floor. 2. Put your hands on the armrests so your elbows are bent and your fingers are pointing forward. Your hands should be about even with the sides of your body. 3. Push down on the armrests and use your arms to lift yourself off the chair. Straighten your elbows and lift yourself up as much as you comfortably can. ? Move your shoulder blades down, and avoid letting your shoulders move up toward your ears. ? Keep your feet on the ground. As you get stronger, your feet should support less of your body weight as you lift yourself up. 4. Hold for __________ seconds. 5. Slowly lower yourself back into the chair. Repeat __________ times. Complete this exercise __________ times a day. Wall push-ups  1. Stand so you are facing a stable wall. Your feet should be about one arm-length away from the wall. 2. Lean forward and place your palms on the wall at shoulder height. 3. Keep your feet flat on the floor as you bend your elbows and lean forward toward the wall. 4. Hold for __________ seconds. 5. Straighten your elbows to push yourself back to the starting position. Repeat __________ times. Complete this exercise  __________ times a day. This information is not intended to replace advice given to you by your health care provider. Make sure you discuss any questions you have with your health care provider. Document Released: 11/24/2004 Document Revised: 05/04/2018 Document Reviewed: 02/09/2018 Elsevier Patient Education  2020 [place].  High Cholesterol  High cholesterol is a condition in which the blood has high levels of a white, waxy, fat-like substance (cholesterol). The  human body needs small amounts of cholesterol. The liver makes all the cholesterol that the body needs. Extra (excess) cholesterol comes from the food that we eat. Cholesterol is carried from the liver by the blood through the blood vessels. If you have high cholesterol, deposits (plaques) may build up on the walls of your blood vessels (arteries). Plaques make the arteries narrower and stiffer. Cholesterol plaques increase your risk for heart attack and stroke. Work with your health care provider to keep your cholesterol levels in a healthy range. What increases the risk? This condition is more likely to develop in people who:  Eat foods that are high in animal fat (saturated fat) or cholesterol.  Are overweight.  Are not getting enough exercise.  Have a family history of high cholesterol. What are the signs or symptoms? There are no symptoms of this condition. How is this diagnosed? This condition may be diagnosed from the results of a blood test.  If you are older than age 78, your health care provider may check your cholesterol every 4-6 years.  You may be checked more often if you already have high cholesterol or other risk factors for heart disease. The blood test for cholesterol measures:  "Bad" cholesterol (LDL cholesterol). This is the main type of cholesterol that causes heart disease. The desired level for LDL is less than 100.  "Good" cholesterol (HDL cholesterol). This type helps to protect against heart  disease by cleaning the arteries and carrying the LDL away. The desired level for HDL is 60 or higher.  Triglycerides. These are fats that the body can store or burn for energy. The desired number for triglycerides is lower than 150.  Total cholesterol. This is a measure of the total amount of cholesterol in your blood, including LDL cholesterol, HDL cholesterol, and triglycerides. A healthy number is less than 200. How is this treated? This condition is treated with diet changes, lifestyle changes, and medicines. Diet changes  This may include eating more whole grains, fruits, vegetables, nuts, and fish.  This may also include cutting back on red meat and foods that have a lot of added sugar. Lifestyle changes  Changes may include getting at least 40 minutes of aerobic exercise 3 times a week. Aerobic exercises include walking, biking, and swimming. Aerobic exercise along with a healthy diet can help you maintain a healthy weight.  Changes may also include quitting smoking. Medicines  Medicines are usually given if diet and lifestyle changes have failed to reduce your cholesterol to healthy levels.  Your health care provider may prescribe a statin medicine. Statin medicines have been shown to reduce cholesterol, which can reduce the risk of heart disease. Follow these instructions at home: Eating and drinking If told by your health care provider:  Eat chicken (without skin), fish, veal, shellfish, ground [place] breast, and round or loin cuts of red meat.  Do not eat fried foods or fatty meats, such as hot dogs and salami.  Eat plenty of fruits, such as apples.  Eat plenty of vegetables, such as broccoli, potatoes, and carrots.  Eat beans, peas, and lentils.  Eat grains such as barley, rice, couscous, and bulgur wheat.  Eat pasta without cream sauces.  Use skim or nonfat milk, and eat low-fat or nonfat yogurt and cheeses.  Do not eat or drink whole milk, cream, ice cream,  egg yolks, or hard cheeses.  Do not eat stick margarine or tub margarines that contain trans fats (also called partially hydrogenated oils).  Do not eat saturated tropical oils, such as coconut oil and palm oil.  Do not eat cakes, cookies, crackers, or other baked goods that contain trans fats.  General instructions  Exercise as directed by your health care provider. Increase your activity level with activities such as gardening, walking, and taking the stairs.  Take over-the-counter and prescription medicines only as told by your health care provider.  Do not use any products that contain nicotine or tobacco, such as cigarettes and e-cigarettes. If you need help quitting, ask your health care provider.  Keep all follow-up visits as told by your health care provider. This is important. Contact a health care provider if:  You are struggling to maintain a healthy diet or weight.  You need help to start on an exercise program.  You need help to stop smoking. Get help right away if:  You have chest pain.  You have trouble breathing. This information is not intended to replace advice given to you by your health care provider. Make sure you discuss any questions you have with your health care provider. Document Released: 01/10/2005 Document Revised: 01/13/2017 Document Reviewed: 07/11/2015 Elsevier Patient Education  [place].  Cholesterol Content in Foods Cholesterol is a waxy, fat-like substance that helps to carry fat in the blood. The body needs cholesterol in small amounts, but too much cholesterol can cause damage to the arteries and heart. Most people should eat less than 200 milligrams (mg) of cholesterol a day. Foods with cholesterol  Cholesterol is found in animal-based foods, such as meat, seafood, and dairy. Generally, low-fat dairy and lean meats have less cholesterol than full-fat dairy and fatty meats. The milligrams of cholesterol per serving (mg per serving)  of common cholesterol-containing foods are listed below. Meat and other proteins  Egg -- one large whole egg has 186 mg.  Veal shank -- 4 oz has 141 mg.  Lean ground [place] (93% lean) -- 4 oz has 118 mg.  Fat-trimmed lamb loin -- 4 oz has 106 mg.  Lean ground beef (90% lean) -- 4 oz has 100 mg.  Lobster -- 3.5 oz has 90 mg.  Pork loin chops -- 4 oz has 86 mg.  Canned salmon -- 3.5 oz has 83 mg.  Fat-trimmed beef top loin -- 4 oz has 78 mg.  Frankfurter -- 1 frank (3.5 oz) has 77 mg.  Crab -- 3.5 oz has 71 mg.  Roasted chicken without skin, white meat -- 4 oz has 66 mg.  Light bologna -- 2 oz has 45 mg.  Deli-cut [place] -- 2 oz has 31 mg.  Canned tuna -- 3.5 oz has 31 mg.  Berniece Salines -- 1 oz has 29 mg.  Oysters and mussels (raw) -- 3.5 oz has 25 mg.  Mackerel -- 1 oz has 22 mg.  Trout -- 1 oz has 20 mg.  Pork sausage -- 1 link (1 oz) has 17 mg.  Salmon -- 1 oz has 16 mg.  Tilapia -- 1 oz has 14 mg. Dairy  Soft-serve ice cream --  cup (4 oz) has 103 mg.  Whole-milk yogurt -- 1 cup (8 oz) has 29 mg.  Cheddar cheese -- 1 oz has 28 mg.  American cheese -- 1 oz has 28 mg.  Whole milk -- 1 cup (8 oz) has 23 mg.  2% milk -- 1 cup (8 oz) has 18 mg.  Cream cheese -- 1 tablespoon (Tbsp) has 15 mg.  Cottage cheese --  cup (4 oz) has  14 mg.  Low-fat (1%) milk -- 1 cup (8 oz) has 10 mg.  Sour cream -- 1 Tbsp has 8.5 mg.  Low-fat yogurt -- 1 cup (8 oz) has 8 mg.  Nonfat Greek yogurt -- 1 cup (8 oz) has 7 mg.  Half-and-half cream -- 1 Tbsp has 5 mg. Fats and oils  Cod liver oil -- 1 tablespoon (Tbsp) has 82 mg.  Butter -- 1 Tbsp has 15 mg.  Lard -- 1 Tbsp has 14 mg.  Bacon grease -- 1 Tbsp has 14 mg.  Mayonnaise -- 1 Tbsp has 5-10 mg.  Margarine -- 1 Tbsp has 3-10 mg. Exact amounts of cholesterol in these foods may vary depending on specific ingredients and brands. Foods without cholesterol Most plant-based foods do not have cholesterol unless you  combine them with a food that has cholesterol. Foods without cholesterol include:  Grains and cereals.  Vegetables.  Fruits.  Vegetable oils, such as olive, canola, and sunflower oil.  Legumes, such as peas, beans, and lentils.  Nuts and seeds.  Egg whites. Summary  The body needs cholesterol in small amounts, but too much cholesterol can cause damage to the arteries and heart.  Most people should eat less than 200 milligrams (mg) of cholesterol a day. This information is not intended to replace advice given to you by your health care provider. Make sure you discuss any questions you have with your health care provider. Document Released: 09/06/2016 Document Revised: 12/23/2016 Document Reviewed: 09/06/2016 Elsevier Patient Education  2020 [employer].

## 2019-01-22 NOTE — Progress Notes (Signed)
Virtual Visit via Video Note  I connected with Christopher Small  on 01/22/19 at  1:15 PM EST by a video enabled telemedicine application and verified that I am speaking with the correct person using two identifiers.  Location patient: work [employer] or home office Persons participating in the virtual visit: patient, provider  I discussed the limitations of evaluation and management by telemedicine and the availability of in person appointments. The patient expressed understanding and agreed to proceed.   HPI: 1. Annual  HLD on zetia 10 mg qd able to tolerate ok total chol. 253, LDL 184  He is ok to stop aspirin 81 mg qd ASCVD risk calc. No evidence it will help  Prev could not tolerate lipitor  Tried to change diet but exercise is limited for now 2. Right shoulder pain Xray 12/08/18 negative given steroid shot and oral x 5 days and helped he is thinking he could have slept wrong but went to ED b/c was having right arm numbness as well  Denies neck pain  3. Chronic hoarseness surgery with [facility] 02/15/19 laryngoscopy. He reports voice worse with rain/cold and rec SLP  ROS: See pertinent positives and negatives per HPI. General: weight stable HEENT: +hoarse voice, normal hearing  CV: no chest pain  Lungs: no sob GI: no ab pain  MSK: no neck pain  Skin: no issues  Neuro: no h/a  Psych: stress improved  GU: no issues  Past Medical History:  Diagnosis Date  . Anxiety   . Chicken pox   . Hyperlipidemia   . Pneumonia    newborn    Past Surgical History:  Procedure Laterality Date  . COLONOSCOPY WITH PROPOFOL N/A 10/22/2018   Procedure: COLONOSCOPY WITH PROPOFOL;  Surgeon: [name], MD;  Location: [facility] ENDOSCOPY;  Service: Gastroenterology;  Laterality: N/A;  . ESOPHAGOGASTRODUODENOSCOPY (EGD) WITH PROPOFOL N/A 08/07/2017   Procedure: ESOPHAGOGASTRODUODENOSCOPY (EGD) WITH PROPOFOL;  Surgeon: [name], MD;  Location: [facility] ENDOSCOPY;  Service: Gastroenterology;   Laterality: N/A;  . ROOT CANAL      Family History  Problem Relation Age of Onset  . Diabetes Mother   . Hypertension Mother   . Stroke Mother   . Alcohol abuse Father   . Cancer Maternal Aunt        >type   . Diabetes Paternal Grandmother   . Heart disease Paternal Grandmother   . Cancer Paternal Grandmother        ?type cancer   . Stroke Paternal Grandfather   . Asthma Brother     SOCIAL HX: married    Current Outpatient Medications:  .  Cholecalciferol 1.25 MG (50000 UT) capsule, Take 1 capsule (50,000 Units total) by mouth once a week., Disp: 13 capsule, Rfl: 1 .  cyclobenzaprine (FLEXERIL) 5 MG tablet, Take 1 tablet (5 mg total) by mouth at bedtime as needed for muscle spasms., Disp: 30 tablet, Rfl: 2 .  ezetimibe (ZETIA) 10 MG tablet, Take 10 mg by mouth daily., Disp: , Rfl:  .  LORazepam (ATIVAN) 0.5 MG tablet, Take 0.5-1 tablets (0.25-0.5 mg total) by mouth daily as needed for anxiety., Disp: 30 tablet, Rfl: 2 .  nicotine (NICODERM CQ - DOSED IN MG/24 HOURS) 21 mg/24hr patch, Place 21 mg onto the skin daily., Disp: , Rfl:   EXAM:  VITALS per patient if applicable:  GENERAL: alert, oriented, appears well and in no acute distress  HEENT: atraumatic, conjunttiva clear, no obvious abnormalities on inspection of external nose and ears  NECK: normal movements of the head and neck  LUNGS: on inspection no signs of respiratory distress, breathing rate appears normal, no obvious gross SOB, gasping or wheezing  CV: no obvious cyanosis  MS: moves all visible extremities without noticeable abnormality  PSYCH/NEURO: pleasant and cooperative, no obvious depression or anxiety, speech and thought processing grossly intact  ASSESSMENT AND PLAN:  Discussed the following assessment and plan:  Annual physical exam - Flu shot utd Tdap had 2012/13 check [facility] MMR immune  Consider hep B vaccine  Declines std check Colonoscopy 10/22/18 tubular and hyperplastic polyps Dr.  Vicente Males f/u in 5 years  PSA normal 10/2018   HLD  On zetia 10 mg qd if cant tolerate refer to [facility] cards if HLD not improving for other options failed lipitor due to muscle aches  Ok to stop aspirin 81 mg qd   Vitamin D deficiency - Plan: Vitamin D (25 hydroxy)  Prediabetes - Plan: HgB A1c  -we discussed possible serious and likely etiologies, options for evaluation and workup, limitations of telemedicine visit vs in person visit, treatment, treatment risks and precautions. Pt prefers to treat via telemedicine empirically rather then risking or undertaking an in person visit at this moment. Patient agrees to seek prompt in person care if worsening, new symptoms arise, or if is not improving with treatment.   I discussed the assessment and treatment plan with the patient. The patient was provided an opportunity to ask questions and all were answered. The patient agreed with the plan and demonstrated an understanding of the instructions.   The patient was advised to call back or seek an in-person evaluation if the symptoms worsen or if the condition fails to improve as anticipated.  Time spent 20 minutes  Delorise Jackson, MD

## 2019-03-26 ENCOUNTER — Telehealth: Payer: Self-pay | Admitting: Internal Medicine

## 2019-03-26 NOTE — Telephone Encounter (Signed)
Patient calling back in and rescheduled for 3/10 at 1:00pm.

## 2019-03-26 NOTE — Telephone Encounter (Signed)
Pt's wife called and said pt is having back and shoulder pain. It has been going for a while and he spoke with Dr. Olivia Mackie about it. I scheduled him for 04/17/19 but they were wondering if he could get in sooner?

## 2019-03-26 NOTE — Telephone Encounter (Signed)
Left message to return call 

## 2019-04-03 ENCOUNTER — Ambulatory Visit: Payer: 59 | Admitting: Internal Medicine

## 2019-04-03 ENCOUNTER — Other Ambulatory Visit: Payer: Self-pay

## 2019-04-03 ENCOUNTER — Encounter: Payer: Self-pay | Admitting: Internal Medicine

## 2019-04-03 VITALS — BP 135/80 | HR 95 | Temp 97.9°F | Ht 68.0 in | Wt 232.4 lb

## 2019-04-03 DIAGNOSIS — M549 Dorsalgia, unspecified: Secondary | ICD-10-CM | POA: Diagnosis not present

## 2019-04-03 DIAGNOSIS — M62838 Other muscle spasm: Secondary | ICD-10-CM

## 2019-04-03 DIAGNOSIS — M6282 Rhabdomyolysis: Secondary | ICD-10-CM | POA: Diagnosis not present

## 2019-04-03 DIAGNOSIS — M545 Low back pain, unspecified: Secondary | ICD-10-CM

## 2019-04-03 MED ORDER — CYCLOBENZAPRINE HCL 5 MG PO TABS: 5.0000 mg | ORAL_TABLET | Freq: Every evening | ORAL | 2 refills | Status: DC | PRN

## 2019-04-03 MED ORDER — KETOROLAC TROMETHAMINE 60 MG/2ML IM SOLN
30.0000 mg | Freq: Once | INTRAMUSCULAR | Status: AC
  Administered 2019-04-03: 30 mg via INTRAMUSCULAR

## 2019-04-03 MED ORDER — METHYLPREDNISOLONE ACETATE 40 MG/ML IJ SUSP
40.0000 mg | Freq: Once | INTRAMUSCULAR | Status: AC
  Administered 2019-04-03: 40 mg via INTRAMUSCULAR

## 2019-04-03 NOTE — Patient Instructions (Signed)
Shoulder Exercises Ask your health care provider which exercises are safe for you. Do exercises exactly as told by your health care provider and adjust them as directed. It is normal to feel mild stretching, pulling, tightness, or discomfort as you do these exercises. Stop right away if you feel sudden pain or your pain gets worse. Do not begin these exercises until told by your health care provider. Stretching exercises External rotation and abduction This exercise is sometimes called corner stretch. This exercise rotates your arm outward (external rotation) and moves your arm out from your body (abduction). 1. Stand in a doorway with one of your feet slightly in front of the other. This is called a staggered stance. If you cannot reach your forearms to the door frame, stand facing a corner of a room. 2. Choose one of the following positions as told by your health care provider: ? Place your hands and forearms on the door frame above your head. ? Place your hands and forearms on the door frame at the height of your head. ? Place your hands on the door frame at the height of your elbows. 3. Slowly move your weight onto your front foot until you feel a stretch across your chest and in the front of your shoulders. Keep your head and chest upright and keep your abdominal muscles tight. 4. Hold for __________ seconds. 5. To release the stretch, shift your weight to your back foot. Repeat __________ times. Complete this exercise __________ times a day. Extension, standing 1. Stand and hold a broomstick, a cane, or a similar object behind your back. ? Your hands should be a little wider than shoulder width apart. ? Your palms should face away from your back. 2. Keeping your elbows straight and your shoulder muscles relaxed, move the stick away from your body until you feel a stretch in your shoulders (extension). ? Avoid shrugging your shoulders while you move the stick. Keep your shoulder blades tucked  down toward the middle of your back. 3. Hold for __________ seconds. 4. Slowly return to the starting position. Repeat __________ times. Complete this exercise __________ times a day. Range-of-motion exercises Pendulum  1. Stand near a wall or a surface that you can hold onto for balance. 2. Bend at the waist and let your left / right arm hang straight down. Use your other arm to support you. Keep your back straight and do not lock your knees. 3. Relax your left / right arm and shoulder muscles, and move your hips and your trunk so your left / right arm swings freely. Your arm should swing because of the motion of your body, not because you are using your arm or shoulder muscles. 4. Keep moving your hips and trunk so your arm swings in the following directions, as told by your health care provider: ? Side to side. ? Forward and backward. ? In clockwise and counterclockwise circles. 5. Continue each motion for __________ seconds, or for as long as told by your health care provider. 6. Slowly return to the starting position. Repeat __________ times. Complete this exercise __________ times a day. Shoulder flexion, standing  1. Stand and hold a broomstick, a cane, or a similar object. Place your hands a little more than shoulder width apart on the object. Your left / right hand should be palm up, and your other hand should be palm down. 2. Keep your elbow straight and your shoulder muscles relaxed. Push the stick up with your healthy arm to  raise your left / right arm in front of your body, and then over your head until you feel a stretch in your shoulder (flexion). ? Avoid shrugging your shoulder while you raise your arm. Keep your shoulder blade tucked down toward the middle of your back. 3. Hold for __________ seconds. 4. Slowly return to the starting position. Repeat __________ times. Complete this exercise __________ times a day. Shoulder abduction, standing 1. Stand and hold a broomstick,  a cane, or a similar object. Place your hands a little more than shoulder width apart on the object. Your left / right hand should be palm up, and your other hand should be palm down. 2. Keep your elbow straight and your shoulder muscles relaxed. Push the object across your body toward your left / right side. Raise your left / right arm to the side of your body (abduction) until you feel a stretch in your shoulder. ? Do not raise your arm above shoulder height unless your health care provider tells you to do that. ? If directed, raise your arm over your head. ? Avoid shrugging your shoulder while you raise your arm. Keep your shoulder blade tucked down toward the middle of your back. 3. Hold for __________ seconds. 4. Slowly return to the starting position. Repeat __________ times. Complete this exercise __________ times a day. Internal rotation  1. Place your left / right hand behind your back, palm up. 2. Use your other hand to dangle an exercise band, a towel, or a similar object over your shoulder. Grasp the band with your left / right hand so you are holding on to both ends. 3. Gently pull up on the band until you feel a stretch in the front of your left / right shoulder. The movement of your arm toward the center of your body is called internal rotation. ? Avoid shrugging your shoulder while you raise your arm. Keep your shoulder blade tucked down toward the middle of your back. 4. Hold for __________ seconds. 5. Release the stretch by letting go of the band and lowering your hands. Repeat __________ times. Complete this exercise __________ times a day. Strengthening exercises External rotation  1. Sit in a stable chair without armrests. 2. Secure an exercise band to a stable object at elbow height on your left / right side. 3. Place a soft object, such as a folded towel or a small pillow, between your left / right upper arm and your body to move your elbow about 4 inches (10 cm) away  from your side. 4. Hold the end of the exercise band so it is tight and there is no slack. 5. Keeping your elbow pressed against the soft object, slowly move your forearm out, away from your abdomen (external rotation). Keep your body steady so only your forearm moves. 6. Hold for __________ seconds. 7. Slowly return to the starting position. Repeat __________ times. Complete this exercise __________ times a day. Shoulder abduction  1. Sit in a stable chair without armrests, or stand up. 2. Hold a __________ weight in your left / right hand, or hold an exercise band with both hands. 3. Start with your arms straight down and your left / right palm facing in, toward your body. 4. Slowly lift your left / right hand out to your side (abduction). Do not lift your hand above shoulder height unless your health care provider tells you that this is safe. ? Keep your arms straight. ? Avoid shrugging your shoulder while you  do this movement. Keep your shoulder blade tucked down toward the middle of your back. 5. Hold for __________ seconds. 6. Slowly lower your arm, and return to the starting position. Repeat __________ times. Complete this exercise __________ times a day. Shoulder extension 1. Sit in a stable chair without armrests, or stand up. 2. Secure an exercise band to a stable object in front of you so it is at shoulder height. 3. Hold one end of the exercise band in each hand. Your palms should face each other. 4. Straighten your elbows and lift your hands up to shoulder height. 5. Step back, away from the secured end of the exercise band, until the band is tight and there is no slack. 6. Squeeze your shoulder blades together as you pull your hands down to the sides of your thighs (extension). Stop when your hands are straight down by your sides. Do not let your hands go behind your body. 7. Hold for __________ seconds. 8. Slowly return to the starting position. Repeat __________ times.  Complete this exercise __________ times a day. Shoulder row 1. Sit in a stable chair without armrests, or stand up. 2. Secure an exercise band to a stable object in front of you so it is at waist height. 3. Hold one end of the exercise band in each hand. Position your palms so that your thumbs are facing the ceiling (neutral position). 4. Bend each of your elbows to a 90-degree angle (right angle) and keep your upper arms at your sides. 5. Step back until the band is tight and there is no slack. 6. Slowly pull your elbows back behind you. 7. Hold for __________ seconds. 8. Slowly return to the starting position. Repeat __________ times. Complete this exercise __________ times a day. Shoulder press-ups  1. Sit in a stable chair that has armrests. Sit upright, with your feet flat on the floor. 2. Put your hands on the armrests so your elbows are bent and your fingers are pointing forward. Your hands should be about even with the sides of your body. 3. Push down on the armrests and use your arms to lift yourself off the chair. Straighten your elbows and lift yourself up as much as you comfortably can. ? Move your shoulder blades down, and avoid letting your shoulders move up toward your ears. ? Keep your feet on the ground. As you get stronger, your feet should support less of your body weight as you lift yourself up. 4. Hold for __________ seconds. 5. Slowly lower yourself back into the chair. Repeat __________ times. Complete this exercise __________ times a day. Wall push-ups  1. Stand so you are facing a stable wall. Your feet should be about one arm-length away from the wall. 2. Lean forward and place your palms on the wall at shoulder height. 3. Keep your feet flat on the floor as you bend your elbows and lean forward toward the wall. 4. Hold for __________ seconds. 5. Straighten your elbows to push yourself back to the starting position. Repeat __________ times. Complete this exercise  __________ times a day. This information is not intended to replace advice given to you by your health care provider. Make sure you discuss any questions you have with your health care provider. Document Revised: 05/04/2018 Document Reviewed: 02/09/2018 [employer] Patient Education  2020 [employer].  Thoracic Strain Rehab Ask your health care provider which exercises are safe for you. Do exercises exactly as told by your health care provider and adjust  them as directed. It is normal to feel mild stretching, pulling, tightness, or discomfort as you do these exercises. Stop right away if you feel sudden pain or your pain gets worse. Do not begin these exercises until told by your health care provider. Stretching and range-of-motion exercise This exercise warms up your muscles and joints and improves the movement and flexibility of your back and shoulders. This exercise also helps to relieve pain. Chest and spine stretch  6. Lie down on your back on a firm surface. 7. Roll a towel or a small blanket so it is about 4 inches (10 cm) in diameter. 8. Put the towel lengthwise under the middle of your back so it is under your spine, but not under your shoulder blades. 9. Put your hands behind your head and let your elbows fall to your sides. This will increase your stretch. 10. Take a deep breath (inhale). 11. Hold for __________ seconds. 12. Relax after you breathe out (exhale). Repeat __________ times. Complete this exercise __________ times a day. Strengthening exercises These exercises build strength and endurance in your back and your shoulder blade muscles. Endurance is the ability to use your muscles for a long time, even after they get tired. Alternating arm and leg raises  5. Get on your hands and knees on a firm surface. If you are on a hard floor, you may want to use padding, such as an exercise mat, to cushion your knees. 6. Line up your arms and legs. Your hands should be directly below  your shoulders, and your knees should be directly below your hips. 7. Lift your left leg behind you. At the same time, raise your right arm and straighten it in front of you. ? Do not lift your leg higher than your hip. ? Do not lift your arm higher than your shoulder. ? Keep your abdominal and back muscles tight. ? Keep your hips facing the ground. ? Do not arch your back. ? Keep your balance carefully, and do not hold your breath. 8. Hold for __________ seconds. 9. Slowly return to the starting position and repeat with your right leg and your left arm. Repeat __________ times. Complete this exercise __________ times a day. Straight arm rows This exercise is also called shoulder extension exercise. 7. Stand with your feet shoulder width apart. 8. Secure an exercise band to a stable object in front of you so the band is at or above shoulder height. 9. Hold one end of the exercise band in each hand. 10. Straighten your elbows and lift your hands up to shoulder height. 11. Step back, away from the secured end of the exercise band, until the band stretches. 12. Squeeze your shoulder blades together and pull your hands down to the sides of your thighs. Stop when your hands are straight down by your sides. This is shoulder extension. Do not let your hands go behind your body. 13. Hold for __________ seconds. 14. Slowly return to the starting position. Repeat __________ times. Complete this exercise __________ times a day. Prone shoulder external rotation 5. Lie on your abdomen on a firm bed so your left / right forearm hangs over the edge of the bed and your upper arm is on the bed, straight out from your body. This is the prone position. ? Your elbow should be bent. ? Your palm should be facing your feet. 6. If instructed, hold a __________ weight in your hand. 7. Squeeze your shoulder blade toward the middle of  your back. Do not let your shoulder lift toward your ear. 8. Keep your elbow bent  in a 90-degree angle (right angle) while you slowly move your forearm up toward the ceiling. Move your forearm up to the height of the bed, toward your head. This is external rotation. ? Your upper arm should not move. ? At the top of the movement, your palm should face the floor. 9. Hold for __________ seconds. 10. Slowly return to the starting position and relax your muscles. Repeat __________ times. Complete this exercise __________ times a day. Rowing scapular retraction This is an exercise in which the shoulder blades (scapulae) are pulled toward each other (retraction). 5. Sit in a stable chair without armrests, or stand up. 6. Secure an exercise band to a stable object in front of you so the band is at shoulder height. 7. Hold one end of the exercise band in each hand. Your palms should face down. 8. Bring your arms out straight in front of you. 9. Step back, away from the secured end of the exercise band, until the band stretches. 10. Pull the band backward. As you do this, bend your elbows and squeeze your shoulder blades together, but avoid letting the rest of your body move. Do not shrug your shoulders upward while you do this. 11. Stop when your elbows are at your sides or slightly behind your body. 12. Hold for __________ seconds. 13. Slowly straighten your arms to return to the starting position. Repeat __________ times. Complete this exercise __________ times a day. Posture and body mechanics Good posture and healthy body mechanics can help to relieve stress in your body's tissues and joints. Body mechanics refers to the movements and positions of your body while you do your daily activities. Posture is part of body mechanics. Good posture means:  Your spine is in its natural S-curve position (neutral).  Your shoulders are pulled back slightly.  Your head is not tipped forward. Follow these guidelines to improve your posture and body mechanics in your everyday  activities. Standing   When standing, keep your spine neutral and your feet about hip width apart. Keep a slight bend in your knees. Your ears, shoulders, and hips should line up with each other.  When you do a task in which you lean forward while standing in one place for a long time, place one foot up on a stable object that is 2-4 inches (5-10 cm) high, such as a footstool. This helps keep your spine neutral. Sitting   When sitting, keep your spine neutral and keep your feet flat on the floor. Use a footrest, if necessary, and keep your thighs parallel to the floor. Avoid rounding your shoulders, and avoid tilting your head forward.  When working at a desk or a computer, keep your desk at a height where your hands are slightly lower than your elbows. Slide your chair under your desk so you are close enough to maintain good posture.  When working at a computer, place your monitor at a height where you are looking straight ahead and you do not have to tilt your head forward or downward to look at the screen. Resting When lying down and resting, avoid positions that are most painful for you.  If you have pain with activities such as sitting, bending, stooping, or squatting (flexion-basedactivities), lie in a position in which your body does not bend very much. For example, avoid curling up on your side with your arms and knees near  your chest (fetal position).  If you have pain with activities such as standing for a long time or reaching with your arms (extension-basedactivities), lie with your spine in a neutral position and bend your knees slightly. Try the following positions: ? Lie on your side with a pillow between your knees. ? Lie on your back with a pillow under your knees.  Lifting   When lifting objects, keep your feet at least shoulder width apart and tighten your abdominal muscles.  Bend your knees and hips and keep your spine neutral. It is important to lift using the  strength of your legs, not your back. Do not lock your knees straight out.  Always ask for help to lift heavy or awkward objects. This information is not intended to replace advice given to you by your health care provider. Make sure you discuss any questions you have with your health care provider. Document Revised: 05/04/2018 Document Reviewed: 02/19/2018 Else[employer] Patient Education  [place] or Strain Rehab Ask your health care provider which exercises are safe for you. Do exercises exactly as told by your health care provider and adjust them as directed. It is normal to feel mild stretching, pulling, tightness, or discomfort as you do these exercises. Stop right away if you feel sudden pain or your pain gets worse. Do not begin these exercises until told by your health care provider. Stretching and range-of-motion exercises These exercises warm up your muscles and joints and improve the movement and flexibility of your back. These exercises also help to relieve pain, numbness, and tingling. Lumbar rotation  13. Lie on your back on a firm surface and bend your knees. 14. Straighten your arms out to your sides so each arm forms a 90-degree angle (right angle) with a side of your body. 15. Slowly move (rotate) both of your knees to one side of your body until you feel a stretch in your lower back (lumbar). Try not to let your shoulders lift off the floor. 16. Hold this position for __________ seconds. 17. Tense your abdominal muscles and slowly move your knees back to the starting position. 18. Repeat this exercise on the other side of your body. Repeat __________ times. Complete this exercise __________ times a day. Single knee to chest  10. Lie on your back on a firm surface with both legs straight. 11. Bend one of your knees. Use your hands to move your knee up toward your chest until you feel a gentle stretch in your lower back and buttock. ? Hold your leg in  this position by holding on to the front of your knee. ? Keep your other leg as straight as possible. 12. Hold this position for __________ seconds. 13. Slowly return to the starting position. 14. Repeat with your other leg. Repeat __________ times. Complete this exercise __________ times a day. Prone extension on elbows  15. Lie on your abdomen on a firm surface (prone position). 16. Prop yourself up on your elbows. 17. Use your arms to help lift your chest up until you feel a gentle stretch in your abdomen and your lower back. ? This will place some of your body weight on your elbows. If this is uncomfortable, try stacking pillows under your chest. ? Your hips should stay down, against the surface that you are lying on. Keep your hip and back muscles relaxed. 18. Hold this position for __________ seconds. 19. Slowly relax your upper body and return to the starting  position. Repeat __________ times. Complete this exercise __________ times a day. Strengthening exercises These exercises build strength and endurance in your back. Endurance is the ability to use your muscles for a long time, even after they get tired. Pelvic tilt This exercise strengthens the muscles that lie deep in the abdomen. 11. Lie on your back on a firm surface. Bend your knees and keep your feet flat on the floor. 12. Tense your abdominal muscles. Tip your pelvis up toward the ceiling and flatten your lower back into the floor. ? To help with this exercise, you may place a small towel under your lower back and try to push your back into the towel. 13. Hold this position for __________ seconds. 14. Let your muscles relax completely before you repeat this exercise. Repeat __________ times. Complete this exercise __________ times a day. Alternating arm and leg raises  14. Get on your hands and knees on a firm surface. If you are on a hard floor, you may want to use padding, such as an exercise mat, to cushion your  knees. 15. Line up your arms and legs. Your hands should be directly below your shoulders, and your knees should be directly below your hips. 16. Lift your left leg behind you. At the same time, raise your right arm and straighten it in front of you. ? Do not lift your leg higher than your hip. ? Do not lift your arm higher than your shoulder. ? Keep your abdominal and back muscles tight. ? Keep your hips facing the ground. ? Do not arch your back. ? Keep your balance carefully, and do not hold your breath. 17. Hold this position for __________ seconds. 18. Slowly return to the starting position. 19. Repeat with your right leg and your left arm. Repeat __________ times. Complete this exercise __________ times a day. Abdominal set with straight leg raise  4. Lie on your back on a firm surface. 5. Bend one of your knees and keep your other leg straight. 6. Tense your abdominal muscles and lift your straight leg up, 4-6 inches (10-15 cm) off the ground. 7. Keep your abdominal muscles tight and hold this position for __________ seconds. ? Do not hold your breath. ? Do not arch your back. Keep it flat against the ground. 8. Keep your abdominal muscles tense as you slowly lower your leg back to the starting position. 9. Repeat with your other leg. Repeat __________ times. Complete this exercise __________ times a day. Single leg lower with bent knees 3. Lie on your back on a firm surface. 4. Tense your abdominal muscles and lift your feet off the floor, one foot at a time, so your knees and hips are bent in 90-degree angles (right angles). ? Your knees should be over your hips and your lower legs should be parallel to the floor. 5. Keeping your abdominal muscles tense and your knee bent, slowly lower one of your legs so your toe touches the ground. 6. Lift your leg back up to return to the starting position. ? Do not hold your breath. ? Do not let your back arch. Keep your back flat against  the ground. 7. Repeat with your other leg. Repeat __________ times. Complete this exercise __________ times a day. Posture and body mechanics Good posture and healthy body mechanics can help to relieve stress in your body's tissues and joints. Body mechanics refers to the movements and positions of your body while you do your daily activities. Posture is  part of body mechanics. Good posture means:  Your spine is in its natural S-curve position (neutral).  Your shoulders are pulled back slightly.  Your head is not tipped forward. Follow these guidelines to improve your posture and body mechanics in your everyday activities. Standing   When standing, keep your spine neutral and your feet about hip width apart. Keep a slight bend in your knees. Your ears, shoulders, and hips should line up.  When you do a task in which you stand in one place for a long time, place one foot up on a stable object that is 2-4 inches (5-10 cm) high, such as a footstool. This helps keep your spine neutral. Sitting   When sitting, keep your spine neutral and keep your feet flat on the floor. Use a footrest, if necessary, and keep your thighs parallel to the floor. Avoid rounding your shoulders, and avoid tilting your head forward.  When working at a desk or a computer, keep your desk at a height where your hands are slightly lower than your elbows. Slide your chair under your desk so you are close enough to maintain good posture.  When working at a computer, place your monitor at a height where you are looking straight ahead and you do not have to tilt your head forward or downward to look at the screen. Resting  When lying down and resting, avoid positions that are most painful for you.  If you have pain with activities such as sitting, bending, stooping, or squatting, lie in a position in which your body does not bend very much. For example, avoid curling up on your side with your arms and knees near your  chest (fetal position).  If you have pain with activities such as standing for a long time or reaching with your arms, lie with your spine in a neutral position and bend your knees slightly. Try the following positions: ? Lying on your side with a pillow between your knees. ? Lying on your back with a pillow under your knees. Lifting   When lifting objects, keep your feet at least shoulder width apart and tighten your abdominal muscles.  Bend your knees and hips and keep your spine neutral. It is important to lift using the strength of your legs, not your back. Do not lock your knees straight out.  Always ask for help to lift heavy or awkward objects. This information is not intended to replace advice given to you by your health care provider. Make sure you discuss any questions you have with your health care provider. Document Revised: 05/04/2018 Document Reviewed: 02/01/2018 Elsevier Patient Education  [place].  Back Exercises The following exercises strengthen the muscles that help to support the trunk and back. They also help to keep the lower back flexible. Doing these exercises can help to prevent back pain or lessen existing pain.  If you have back pain or discomfort, try doing these exercises 2-3 times each day or as told by your health care provider.  As your pain improves, do them once each day, but increase the number of times that you repeat the steps for each exercise (do more repetitions).  To prevent the recurrence of back pain, continue to do these exercises once each day or as told by your health care provider. Do exercises exactly as told by your health care provider and adjust them as directed. It is normal to feel mild stretching, pulling, tightness, or discomfort as you do these exercises,  but you should stop right away if you feel sudden pain or your pain gets worse. Exercises Single knee to chest Repeat these steps 3-5 times for each leg: 1. Lie on your  back on a firm bed or the floor with your legs extended. 2. Bring one knee to your chest. Your other leg should stay extended and in contact with the floor. 3. Hold your knee in place by grabbing your knee or thigh with both hands and hold. 4. Pull on your knee until you feel a gentle stretch in your lower back or buttocks. 5. Hold the stretch for 10-30 seconds. 6. Slowly release and straighten your leg. Pelvic tilt Repeat these steps 5-10 times: 1. Lie on your back on a firm bed or the floor with your legs extended. 2. Bend your knees so they are pointing toward the ceiling and your feet are flat on the floor. 3. Tighten your lower abdominal muscles to press your lower back against the floor. This motion will tilt your pelvis so your tailbone points up toward the ceiling instead of pointing to your feet or the floor. 4. With gentle tension and even breathing, hold this position for 5-10 seconds. Cat-cow Repeat these steps until your lower back becomes more flexible: 1. Get into a hands-and-knees position on a firm surface. Keep your hands under your shoulders, and keep your knees under your hips. You may place padding under your knees for comfort. 2. Let your head hang down toward your chest. Contract your abdominal muscles and point your tailbone toward the floor so your lower back becomes rounded like the back of a cat. 3. Hold this position for 5 seconds. 4. Slowly lift your head, let your abdominal muscles relax and point your tailbone up toward the ceiling so your back forms a sagging arch like the back of a cow. 5. Hold this position for 5 seconds.  Press-ups Repeat these steps 5-10 times: 1. Lie on your abdomen (face-down) on the floor. 2. Place your palms near your head, about shoulder-width apart. 3. Keeping your back as relaxed as possible and keeping your hips on the floor, slowly straighten your arms to raise the top half of your body and lift your shoulders. Do not use your back  muscles to raise your upper torso. You may adjust the placement of your hands to make yourself more comfortable. 4. Hold this position for 5 seconds while you keep your back relaxed. 5. Slowly return to lying flat on the floor.  Bridges Repeat these steps 10 times: 1. Lie on your back on a firm surface. 2. Bend your knees so they are pointing toward the ceiling and your feet are flat on the floor. Your arms should be flat at your sides, next to your body. 3. Tighten your buttocks muscles and lift your buttocks off the floor until your waist is at almost the same height as your knees. You should feel the muscles working in your buttocks and the back of your thighs. If you do not feel these muscles, slide your feet 1-2 inches farther away from your buttocks. 4. Hold this position for 3-5 seconds. 5. Slowly lower your hips to the starting position, and allow your buttocks muscles to relax completely. If this exercise is too easy, try doing it with your arms crossed over your chest. Abdominal crunches Repeat these steps 5-10 times: 1. Lie on your back on a firm bed or the floor with your legs extended. 2. Bend your knees so  they are pointing toward the ceiling and your feet are flat on the floor. 3. Cross your arms over your chest. 4. Tip your chin slightly toward your chest without bending your neck. 5. Tighten your abdominal muscles and slowly raise your trunk (torso) high enough to lift your shoulder blades a tiny bit off the floor. Avoid raising your torso higher than that because it can put too much stress on your low back and does not help to strengthen your abdominal muscles. 6. Slowly return to your starting position. Back lifts Repeat these steps 5-10 times: 1. Lie on your abdomen (face-down) with your arms at your sides, and rest your forehead on the floor. 2. Tighten the muscles in your legs and your buttocks. 3. Slowly lift your chest off the floor while you keep your hips pressed to  the floor. Keep the back of your head in line with the curve in your back. Your eyes should be looking at the floor. 4. Hold this position for 3-5 seconds. 5. Slowly return to your starting position. Contact a health care provider if:  Your back pain or discomfort gets much worse when you do an exercise.  Your worsening back pain or discomfort does not lessen within 2 hours after you exercise. If you have any of these problems, stop doing these exercises right away. Do not do them again unless your health care provider says that you can. Get help right away if:  You develop sudden, severe back pain. If this happens, stop doing the exercises right away. Do not do them again unless your health care provider says that you can. This information is not intended to replace advice given to you by your health care provider. Make sure you discuss any questions you have with your health care provider. Document Revised: 05/17/2018 Document Reviewed: 10/12/2017 Elsevier Patient Education  [place].

## 2019-04-03 NOTE — Addendum Note (Signed)
Addended by: Thressa Sheller on: 04/03/2019 02:05 PM   Modules accepted: Orders

## 2019-04-03 NOTE — Progress Notes (Signed)
Chief Complaint  Patient presents with  . Back Pain    twisted the wrong way about a month ago. Pain 3/10 today, better but not gone away.   . Shoulder Pain   F/u  C/o low back pain and lower mid back pain 2-3/10 twisted his back wrong ~1 month ago tried back brace heat lidocaine 85% better but not 100% pain was sharp he does do heavy lifting with car work he has gained weight was 209 10/2018 tried flexeril 5 mg qhs w/o help   HLD stopped zetia 10 mg qd and taking otc cholesterol supplement    Review of Systems  Musculoskeletal: Positive for back pain.   Past Medical History:  Diagnosis Date  . Anxiety   . Chicken pox   . Hyperlipidemia   . Pneumonia    newborn   Past Surgical History:  Procedure Laterality Date  . COLONOSCOPY WITH PROPOFOL N/A 10/22/2018   Procedure: COLONOSCOPY WITH PROPOFOL;  Surgeon: [name], MD;  Location: [facility] ENDOSCOPY;  Service: Gastroenterology;  Laterality: N/A;  . ESOPHAGOGASTRODUODENOSCOPY (EGD) WITH PROPOFOL N/A 08/07/2017   Procedure: ESOPHAGOGASTRODUODENOSCOPY (EGD) WITH PROPOFOL;  Surgeon: [name], MD;  Location: [facility] ENDOSCOPY;  Service: Gastroenterology;  Laterality: N/A;  . ROOT CANAL     Family History  Problem Relation Age of Onset  . Diabetes Mother   . Hypertension Mother   . Stroke Mother   . Alcohol abuse Father   . Cancer Maternal Aunt        >type   . Diabetes Paternal Grandmother   . Heart disease Paternal Grandmother   . Cancer Paternal Grandmother        ?type cancer   . Stroke Paternal Grandfather   . Asthma Brother    Social History   Socioeconomic History  . Marital status: Married    Spouse name: Not on file  . Number of children: Not on file  . Years of education: Not on file  . Highest education level: Not on file  Occupational History  . Not on file  Tobacco Use  . Smoking status: Former Smoker    Types: Cigarettes    Quit date: 2019    Years since quitting: 2.1  . Smokeless tobacco: Never Used   . Tobacco comment: smoker since age 62 on and off now 1-2 cig/day max 1/2 ppd   Substance and Sexual Activity  . Alcohol use: Yes  . Drug use: Yes    Comment: thc sometimes   . Sexual activity: Yes  Other Topics Concern  . Not on file  Social History Narrative   Married    Former Occupational psychologist    2 brothers    Social Determinants of Radio broadcast assistant Strain:   . Difficulty of Paying Living Expenses: Not on file  Food Insecurity:   . Worried About Charity fundraiser in the Last Year: Not on file  . Ran Out of Food in the Last Year: Not on file  Transportation Needs:   . Lack of Transportation (Medical): Not on file  . Lack of Transportation (Non-Medical): Not on file  Physical Activity:   . Days of Exercise per Week: Not on file  . Minutes of Exercise per Session: Not on file  Stress:   . Feeling of Stress : Not on file  Social Connections:   . Frequency of Communication with Friends and Family: Not on file  . Frequency of Social  Gatherings with Friends and Family: Not on file  . Attends Religious Services: Not on file  . Active Member of Clubs or Organizations: Not on file  . Attends Archivist Meetings: Not on file  . Marital Status: Not on file  Intimate Partner Violence:   . Fear of Current or Ex-Partner: Not on file  . Emotionally Abused: Not on file  . Physically Abused: Not on file  . Sexually Abused: Not on file   Current Meds  Medication Sig  . Cholecalciferol 1.25 MG (50000 UT) capsule Take 1 capsule (50,000 Units total) by mouth once a week.  . cyclobenzaprine (FLEXERIL) 5 MG tablet Take 1-2 tablets (5-10 mg total) by mouth at bedtime as needed for muscle spasms.  . [DISCONTINUED] cyclobenzaprine (FLEXERIL) 5 MG tablet Take 1 tablet (5 mg total) by mouth at bedtime as needed for muscle spasms.   Allergies  Allergen Reactions  . Lipitor [Atorvastatin Calcium]     Muscle weakness and pain and  elevated CK   No results found for this or any previous visit (from the past 2160 hour(s)). Objective  Body mass index is 35.34 kg/m. Wt Readings from Last 3 Encounters:  04/03/19 232 lb 6.4 oz (105.4 kg)  01/22/19 215 lb (97.5 kg)  12/08/18 215 lb (97.5 kg)   Temp Readings from Last 3 Encounters:  04/03/19 97.9 F (36.6 C) (Temporal)  12/08/18 99.3 F (37.4 C) (Oral)  10/22/18 (!) 97.1 F (36.2 C) (Tympanic)   BP Readings from Last 3 Encounters:  04/03/19 135/80  12/08/18 (!) 145/90  10/22/18 119/84   Pulse Readings from Last 3 Encounters:  04/03/19 95  12/08/18 85  10/22/18 77    Physical Exam Vitals and nursing note reviewed.  Constitutional:      Appearance: Normal appearance. He is well-developed and well-groomed.  HENT:     Head: Normocephalic and atraumatic.  Eyes:     Conjunctiva/sclera: Conjunctivae normal.     Pupils: Pupils are equal, round, and reactive to light.  Cardiovascular:     Rate and Rhythm: Normal rate and regular rhythm.     Heart sounds: Normal heart sounds. No murmur.  Pulmonary:     Effort: Pulmonary effort is normal.     Breath sounds: Normal breath sounds.  Skin:    General: Skin is warm and dry.  Neurological:     General: No focal deficit present.     Mental Status: He is alert and oriented to person, place, and time. Mental status is at baseline.     Gait: Gait normal.  Psychiatric:        Attention and Perception: Attention and perception normal.        Mood and Affect: Mood and affect normal.        Speech: Speech normal.        Behavior: Behavior normal. Behavior is cooperative.        Thought Content: Thought content normal.        Cognition and Memory: Cognition and memory normal.        Judgment: Judgment normal.     Assessment  Plan  Acute low back pain, unspecified back pain laterality, unspecified whether sciatica present - Plan: DG Lumbar Spine Complete, DG Thoracic Spine 2 View, cyclobenzaprine (FLEXERIL) 5 MG  tablet Back exercises wt loss  If not better at f/u consider MRI low back   Mid back pain - Plan: DG Lumbar Spine Complete, DG Thoracic Spine 2 View, cyclobenzaprine (FLEXERIL) 5  MG tablet  Muscle spasm - Plan: cyclobenzaprine (FLEXERIL) 5-10 mg MG tablet qhs prn   HM Flu shot utd Tdap had 2012/13 check [facility] MMR immune  Consider hep B vaccine  Declines std check Colonoscopy 10/22/18 tubular and hyperplastic polyps Dr. Vicente Males f/u in 5 years  PSA normal 10/2018  Provider: Dr. Olivia Mackie McLean-Scocuzza-Internal Medicine

## 2019-04-17 ENCOUNTER — Ambulatory Visit: Payer: 59 | Admitting: Internal Medicine

## 2019-04-25 ENCOUNTER — Other Ambulatory Visit (INDEPENDENT_AMBULATORY_CARE_PROVIDER_SITE_OTHER): Payer: 59

## 2019-04-25 ENCOUNTER — Other Ambulatory Visit: Payer: Self-pay

## 2019-04-25 DIAGNOSIS — E782 Mixed hyperlipidemia: Secondary | ICD-10-CM

## 2019-04-25 DIAGNOSIS — R748 Abnormal levels of other serum enzymes: Secondary | ICD-10-CM

## 2019-04-25 DIAGNOSIS — Z1389 Encounter for screening for other disorder: Secondary | ICD-10-CM

## 2019-04-25 DIAGNOSIS — R7303 Prediabetes: Secondary | ICD-10-CM

## 2019-04-25 DIAGNOSIS — E559 Vitamin D deficiency, unspecified: Secondary | ICD-10-CM

## 2019-04-25 DIAGNOSIS — Z0000 Encounter for general adult medical examination without abnormal findings: Secondary | ICD-10-CM | POA: Diagnosis not present

## 2019-04-25 LAB — CBC WITH DIFFERENTIAL/PLATELET
Basophils Absolute: 0 10*3/uL (ref 0.0–0.1)
Basophils Relative: 0.2 % (ref 0.0–3.0)
Eosinophils Absolute: 0.3 10*3/uL (ref 0.0–0.7)
Eosinophils Relative: 3.1 % (ref 0.0–5.0)
HCT: 43 % (ref 39.0–52.0)
Hemoglobin: 14.4 g/dL (ref 13.0–17.0)
Lymphocytes Relative: 41.8 % (ref 12.0–46.0)
Lymphs Abs: 3.5 10*3/uL (ref 0.7–4.0)
MCHC: 33.4 g/dL (ref 30.0–36.0)
MCV: 82.6 fl (ref 78.0–100.0)
Monocytes Absolute: 0.4 10*3/uL (ref 0.1–1.0)
Monocytes Relative: 5 % (ref 3.0–12.0)
Neutro Abs: 4.1 10*3/uL (ref 1.4–7.7)
Neutrophils Relative %: 49.9 % (ref 43.0–77.0)
Platelets: 281 10*3/uL (ref 150.0–400.0)
RBC: 5.2 Mil/uL (ref 4.22–5.81)
RDW: 14.4 % (ref 11.5–15.5)
WBC: 8.3 10*3/uL (ref 4.0–10.5)

## 2019-04-25 LAB — COMPREHENSIVE METABOLIC PANEL
ALT: 61 U/L — ABNORMAL HIGH (ref 0–53)
AST: 39 U/L — ABNORMAL HIGH (ref 0–37)
Albumin: 4.8 g/dL (ref 3.5–5.2)
Alkaline Phosphatase: 70 U/L (ref 39–117)
BUN: 16 mg/dL (ref 6–23)
CO2: 27 mEq/L (ref 19–32)
Calcium: 9.6 mg/dL (ref 8.4–10.5)
Chloride: 103 mEq/L (ref 96–112)
Creatinine, Ser: 1.12 mg/dL (ref 0.40–1.50)
GFR: 85.51 mL/min (ref >60.00)
Glucose, Bld: 138 mg/dL — ABNORMAL HIGH (ref 70–99)
Potassium: 4 mEq/L (ref 3.5–5.1)
Sodium: 137 mEq/L (ref 135–145)
Total Bilirubin: 0.6 mg/dL (ref 0.2–1.2)
Total Protein: 6.7 g/dL (ref 6.0–8.3)

## 2019-04-25 LAB — LIPID PANEL
Cholesterol: 221 mg/dL — ABNORMAL HIGH (ref 0–200)
HDL: 32 mg/dL — ABNORMAL LOW (ref >39.00)
LDL Cholesterol: 151 mg/dL — ABNORMAL HIGH (ref 0–99)
NonHDL: 189.05
Total CHOL/HDL Ratio: 7
Triglycerides: 189 mg/dL — ABNORMAL HIGH (ref 0.0–149.0)
VLDL: 37.8 mg/dL (ref 0.0–40.0)

## 2019-04-25 LAB — HEMOGLOBIN A1C: Hgb A1c MFr Bld: 5.9 % (ref 4.6–6.5)

## 2019-04-25 LAB — VITAMIN D 25 HYDROXY (VIT D DEFICIENCY, FRACTURES): VITD: 30.96 ng/mL (ref 30.00–100.00)

## 2019-04-25 LAB — CK: Total CK: 2006 U/L — ABNORMAL HIGH (ref 7–232)

## 2019-04-26 LAB — URINALYSIS, ROUTINE W REFLEX MICROSCOPIC
Bacteria, UA: NONE SEEN /HPF
Bilirubin Urine: NEGATIVE
Glucose, UA: NEGATIVE
Hyaline Cast: NONE SEEN /LPF
Ketones, ur: NEGATIVE
Leukocytes,Ua: NEGATIVE
Nitrite: NEGATIVE
Protein, ur: NEGATIVE
Specific Gravity, Urine: 1.012 (ref 1.001–1.03)
Squamous Epithelial / HPF: NONE SEEN /HPF (ref <=5)
WBC, UA: NONE SEEN /HPF (ref 0–5)
pH: <=5 (ref 5.0–8.0)

## 2019-04-28 ENCOUNTER — Telehealth: Payer: Self-pay | Admitting: Internal Medicine

## 2019-04-28 ENCOUNTER — Other Ambulatory Visit: Payer: Self-pay | Admitting: Internal Medicine

## 2019-04-28 DIAGNOSIS — G729 Myopathy, unspecified: Secondary | ICD-10-CM

## 2019-04-28 DIAGNOSIS — R7989 Other specified abnormal findings of blood chemistry: Secondary | ICD-10-CM

## 2019-04-28 DIAGNOSIS — M6282 Rhabdomyolysis: Secondary | ICD-10-CM

## 2019-04-28 DIAGNOSIS — R748 Abnormal levels of other serum enzymes: Secondary | ICD-10-CM

## 2019-04-28 NOTE — Addendum Note (Signed)
Addended by: Orland Mustard on: 04/28/2019 11:10 PM   Modules accepted: Orders

## 2019-04-28 NOTE — Telephone Encounter (Signed)
Plan: 1. Schedule microlaryngoscopy with bilateral vocal fold excision   [facility]  [address] [place], [place] [address]  [phone]  [place], Durenda Age, [place]  Otolaryngology  Physician Office [place], [place] [address]  [phone]  [phone] (Fax)     Pt needs to call and schedule f/u with [facility] for the above procedure he never had as well   Fax note to [facility] to schedule and inform pt   Thanks [place]

## 2019-04-28 NOTE — Telephone Encounter (Signed)
Pt needs labs asap Monday 04/29/19 due to his 04/25/19 labs abnormal   He will have to do the urine lab at [facility] as I dont think we have the correct container in our office   All other labs can be done tomorrow 04/29/19 at our office  Thanks  Garreth [place]

## 2019-04-29 ENCOUNTER — Encounter: Payer: Self-pay | Admitting: Internal Medicine

## 2019-04-29 NOTE — Telephone Encounter (Signed)
It's just a random urine, collected in a sterile urine cup

## 2019-05-01 ENCOUNTER — Telehealth: Payer: Self-pay | Admitting: Internal Medicine

## 2019-05-01 ENCOUNTER — Other Ambulatory Visit (INDEPENDENT_AMBULATORY_CARE_PROVIDER_SITE_OTHER): Payer: 59

## 2019-05-01 ENCOUNTER — Other Ambulatory Visit: Payer: Self-pay

## 2019-05-01 DIAGNOSIS — M6282 Rhabdomyolysis: Secondary | ICD-10-CM

## 2019-05-01 DIAGNOSIS — R748 Abnormal levels of other serum enzymes: Secondary | ICD-10-CM | POA: Diagnosis not present

## 2019-05-01 DIAGNOSIS — R7989 Other specified abnormal findings of blood chemistry: Secondary | ICD-10-CM | POA: Diagnosis not present

## 2019-05-01 DIAGNOSIS — G729 Myopathy, unspecified: Secondary | ICD-10-CM

## 2019-05-01 LAB — COMPREHENSIVE METABOLIC PANEL
ALT: 40 U/L (ref 0–53)
AST: 20 U/L (ref 0–37)
Albumin: 4.8 g/dL (ref 3.5–5.2)
Alkaline Phosphatase: 61 U/L (ref 39–117)
BUN: 13 mg/dL (ref 6–23)
CO2: 26 mEq/L (ref 19–32)
Calcium: 9.4 mg/dL (ref 8.4–10.5)
Chloride: 105 mEq/L (ref 96–112)
Creatinine, Ser: 1.13 mg/dL (ref 0.40–1.50)
GFR: 84.63 mL/min (ref >60.00)
Glucose, Bld: 101 mg/dL — ABNORMAL HIGH (ref 70–99)
Potassium: 4 mEq/L (ref 3.5–5.1)
Sodium: 139 mEq/L (ref 135–145)
Total Bilirubin: 0.5 mg/dL (ref 0.2–1.2)
Total Protein: 6.7 g/dL (ref 6.0–8.3)

## 2019-05-01 LAB — TROPONIN I (HIGH SENSITIVITY): High Sens Troponin I: 7 ng/L (ref 2–17)

## 2019-05-01 LAB — URIC ACID: Uric Acid, Serum: 6 mg/dL (ref 4.0–7.8)

## 2019-05-01 LAB — SEDIMENTATION RATE: Sed Rate: 5 mm/hr (ref 0–15)

## 2019-05-01 LAB — C-REACTIVE PROTEIN: CRP: <1 mg/dL (ref 0.5–20.0)

## 2019-05-01 LAB — CK: Total CK: 490 U/L — ABNORMAL HIGH (ref 7–232)

## 2019-05-01 LAB — PHOSPHORUS: Phosphorus: 3.1 mg/dL (ref 2.3–4.6)

## 2019-05-01 NOTE — Telephone Encounter (Signed)
It wasn't ordered correctly for me to see it during his lab scheduled. So it was not collected. He can go to [facility] since it was ordered there instead, or it can be reordered for our location

## 2019-05-01 NOTE — Telephone Encounter (Signed)
Did patient collect a urine yesterday or will he still need to go to [facility]?

## 2019-05-01 NOTE — Telephone Encounter (Signed)
Patient called for his lab results that where ran today.

## 2019-05-01 NOTE — Telephone Encounter (Signed)
Patient was informed that PCP has not reviewed labs yet.

## 2019-05-01 NOTE — Telephone Encounter (Signed)
Spoken to patient. He stated he can go to [facility], since it will work better with his work schedule.

## 2019-05-02 ENCOUNTER — Telehealth (INDEPENDENT_AMBULATORY_CARE_PROVIDER_SITE_OTHER): Payer: 59 | Admitting: Internal Medicine

## 2019-05-02 ENCOUNTER — Encounter: Payer: Self-pay | Admitting: Internal Medicine

## 2019-05-02 VITALS — Ht 68.0 in | Wt 222.0 lb

## 2019-05-02 DIAGNOSIS — R748 Abnormal levels of other serum enzymes: Secondary | ICD-10-CM

## 2019-05-02 DIAGNOSIS — R253 Fasciculation: Secondary | ICD-10-CM

## 2019-05-02 DIAGNOSIS — R7303 Prediabetes: Secondary | ICD-10-CM | POA: Diagnosis not present

## 2019-05-02 LAB — LACTATE DEHYDROGENASE: LDH: 118 U/L (ref 100–220)

## 2019-05-02 LAB — ALDOLASE: Aldolase: 7.4 U/L (ref <=8.1)

## 2019-05-02 NOTE — Progress Notes (Signed)
Virtual Visit via Video Note  I connected with Christopher Small  on 05/02/19 at  8:15 AM EDT by a video enabled telemedicine application and verified that I am speaking with the correct person using two identifiers.  Location patient: work Environmental manager or home office Persons participating in the virtual visit: patient, provider  I discussed the limitations of evaluation and management by telemedicine and the availability of in person appointments. The patient expressed understanding and agreed to proceed.   HPI: 1. Muscle twitching and elevated CK and elevated lfts c/w rhabdo but enzymes trending down overall been elevated w/in the last 1 year and he had been on statin which was stopped as well months ago  Results for Christopher Small, Christopher Small (MRN [id number]) as of 05/02/2019 08:17  Ref. Range 07/24/2018 08:22 10/25/2018 09:20 04/25/2019 08:14 05/01/2019 08:15  CK Total Latest Ref Range: 7 - 232 U/L 629 (H) 252 (H) 2,006 (H) 490 (H)  He was taking 15 grams protein shake boost, working out hard over the last few weeks and pre work out shake and testosterone booster. Drinking 3L water qd   2. Prediabetes trying healthy diet and exercise  ROS: See pertinent positives and negatives per HPI. Wt down 235 to 222 lbs  Past Medical History:  Diagnosis Date  . Anxiety   . Chicken pox   . Hyperlipidemia   . Pneumonia    newborn    Past Surgical History:  Procedure Laterality Date  . COLONOSCOPY WITH PROPOFOL N/A 10/22/2018   Procedure: COLONOSCOPY WITH PROPOFOL;  Surgeon: [name], MD;  Location[facility] ENDOSCOPY;  Service: Gastroenterology;  Laterality: N/A;  . ESOPHAGOGASTRODUODENOSCOPY (EGD) WITH PROPOFOL N/A 08/07/2017   Procedure: ESOPHAGOGASTRODUODENOSCOPY (EGD) WITH PROPOFOL;  Surgeon: [name], MD;  Location: [facility] ENDOSCOPY;  Service: Gastroenterology;  Laterality: N/A;  . ROOT CANAL      Family History  Problem Relation Age of Onset  . Diabetes Mother   . Hypertension Mother   .  Stroke Mother   . Alcohol abuse Father   . Cancer Maternal Aunt        >type   . Diabetes Paternal Grandmother   . Heart disease Paternal Grandmother   . Cancer Paternal Grandmother        ?type cancer   . Stroke Paternal Grandfather   . Asthma Brother     SOCIAL HX: married    Current Outpatient Medications:  .  cyclobenzaprine (FLEXERIL) 5 MG tablet, Take 1-2 tablets (5-10 mg total) by mouth at bedtime as needed for muscle spasms., Disp: 60 tablet, Rfl: 2 .  Cholecalciferol 1.25 MG (50000 UT) capsule, Take 1 capsule (50,000 Units total) by mouth once a week. (Patient not taking: Reported on 05/02/2019), Disp: 13 capsule, Rfl: 1 .  LORazepam (ATIVAN) 0.5 MG tablet, Take 0.5-1 tablets (0.25-0.5 mg total) by mouth daily as needed for anxiety. (Patient not taking: Reported on 05/02/2019), Disp: 30 tablet, Rfl: 2 .  Multiple Vitamin (MULTIVITAMIN ADULT PO), Take by mouth., Disp: , Rfl:   EXAM:  VITALS per patient if applicable:  GENERAL: alert, oriented, appears well and in no acute distress  HEENT: atraumatic, conjunttiva clear, no obvious abnormalities on inspection of external nose and ears  NECK: normal movements of the head and neck  LUNGS: on inspection no signs of respiratory distress, breathing rate appears normal, no obvious gross SOB, gasping or wheezing  CV: no obvious cyanosis  MS: moves all visible extremities without noticeable abnormality  PSYCH/NEURO: pleasant and cooperative, no obvious  depression or anxiety, speech and thought processing grossly intact  ASSESSMENT AND PLAN:  Discussed the following assessment and plan:  Elevated CK - Plan: Ambulatory referral to Neurology Dr. Manuella Ghazi EMG consider muscle bx  Muscle twitching - Plan: Ambulatory referral to Neurology rec pt sch vocal cord bx at [facility] has been pending for a while  Stop all supplements and pre work out Pending aldolase and urine myoglobulin   Prediabetes Healthy diet and exercise   HM Flu shot  utd Tdap had 2012/13 check [facility] MMR immune  Consider hep B vaccine  Declines std check Colonoscopy9/28/20 tubular and hyperplastic polyps Dr. Vicente Males f/u in 5 years  PSA normal 10/2018    -we discussed possible serious and likely etiologies, options for evaluation and workup, limitations of telemedicine visit vs in person visit, treatment, treatment risks and precautions. Pt prefers to treat via telemedicine empirically rather then risking or undertaking an in person visit at this moment. Patient agrees to seek prompt in person care if worsening, new symptoms arise, or if is not improving with treatment.   I discussed the assessment and treatment plan with the patient. The patient was provided an opportunity to ask questions and all were answered. The patient agreed with the plan and demonstrated an understanding of the instructions.   The patient was advised to call back or seek an in-person evaluation if the symptoms worsen or if the condition fails to improve as anticipated.  Time 20 minutes  Delorise Jackson, MD

## 2020-03-02 ENCOUNTER — Other Ambulatory Visit: Payer: Self-pay

## 2020-03-02 ENCOUNTER — Ambulatory Visit
Admission: EM | Admit: 2020-03-02 | Discharge: 2020-03-02 | Disposition: A | Discharge status: Home / Self Care | Payer: 59 | Attending: Family Medicine | Admitting: Family Medicine

## 2020-03-02 DIAGNOSIS — T148XXA Other injury of unspecified body region, initial encounter: Secondary | ICD-10-CM | POA: Diagnosis not present

## 2020-03-02 MED ORDER — MUPIROCIN 2 % EX OINT: 1.0000 "application " | TOPICAL_OINTMENT | Freq: Two times a day (BID) | CUTANEOUS | 0 refills | Status: AC

## 2020-03-02 NOTE — ED Provider Notes (Signed)
[facility]    CSN: [id number] Arrival date & time: 03/02/20  1321      History   Chief Complaint Chief Complaint  Patient presents with  . Finger Injury    Pt with left hand middle finger avulsion injury about an hour PTA. A car hood came down on his hand and he was attempting to pull his hand back out of the way but it got caught   HPI  45 year old male presents with above complaint.  Patient states that he was working on a car and the hood came down and he was attempting to remove his hand.  He was unable to do so.  He injured his left middle finger. There is an area of avulsed skin.  There is significant bleeding at this time.  Pain 2/10 in severity.  No other complaints.  Past Medical History:  Diagnosis Date  . Anxiety   . Chicken pox   . Hyperlipidemia   . Pneumonia    newborn    Patient Active Problem List   Diagnosis Date Noted  . Annual physical exam 01/22/2019  . Family history of colonic polyps   . Polyp of colon   . Elevated CK 09/26/2018  . Prediabetes 04/13/2018  . Chest pain 01/12/2018  . Anxiety 10/13/2017  . Stress 10/13/2017  . Tendonitis of elbow, left 10/13/2017  . Muscle spasm 10/13/2017  . HLD (hyperlipidemia) 08/14/2017  . Vitamin D deficiency 08/14/2017  . Loose stools 08/14/2017  . Hoarseness 07/12/2017  . Gastroesophageal reflux disease 07/12/2017  . Belching 07/12/2017    Past Surgical History:  Procedure Laterality Date  . COLONOSCOPY WITH PROPOFOL N/A 10/22/2018   Procedure: COLONOSCOPY WITH PROPOFOL;  Surgeon: [name], MD;  Location: [facility] ENDOSCOPY;  Service: Gastroenterology;  Laterality: N/A;  . ESOPHAGOGASTRODUODENOSCOPY (EGD) WITH PROPOFOL N/A 08/07/2017   Procedure: ESOPHAGOGASTRODUODENOSCOPY (EGD) WITH PROPOFOL;  Surgeon: [name], MD;  Location: [facility] ENDOSCOPY;  Service: Gastroenterology;  Laterality: N/A;  . ROOT CANAL         Home Medications    Prior to Admission medications   Medication Sig  Start Date End Date Taking? Authorizing Provider  mupirocin ointment (BACTROBAN) 2 % Apply 1 application topically 2 (two) times daily for 7 days. 03/02/20 03/09/20 Yes Cook, Jayce G, DO  cyclobenzaprine (FLEXERIL) 5 MG tablet Take 1-2 tablets (5-10 mg total) by mouth at bedtime as needed for muscle spasms. 04/03/19   McLean-Scocuzza, Nino Glow, MD  LORazepam (ATIVAN) 0.5 MG tablet Take 0.5-1 tablets (0.25-0.5 mg total) by mouth daily as needed for anxiety. Patient not taking: Reported on 05/02/2019 04/09/18   McLean-Scocuzza, Nino Glow, MD  Multiple Vitamin (MULTIVITAMIN ADULT PO) Take by mouth.    [provider]    Family History Family History  Problem Relation Age of Onset  . Diabetes Mother   . Hypertension Mother   . Stroke Mother   . Alcohol abuse Father   . Cancer Maternal Aunt        >type   . Diabetes Paternal Grandmother   . Heart disease Paternal Grandmother   . Cancer Paternal Grandmother        ?type cancer   . Stroke Paternal Grandfather   . Asthma Brother     Social History Social History   Tobacco Use  . Smoking status: Former Smoker    Types: Cigarettes    Quit date: 2019    Years since quitting: 3.1  . Smokeless tobacco: Never Used  .  Tobacco comment: smoker since age 65 on and off now 1-2 cig/day max 1/2 ppd   Vaping Use  . Vaping Use: Never used  Substance Use Topics  . Alcohol use: Yes    Comment: social  . Drug use: Not Currently    Comment: thc sometimes      Allergies   Lipitor [atorvastatin calcium]   Review of Systems Review of Systems  Constitutional: Negative.   Skin:       Finger injury.    Physical Exam Triage Vital Signs ED Triage Vitals [03/02/20 1346]  Enc Vitals Group     BP (!) 144/103     Pulse Rate 99     Resp 18     Temp 98.3 F (36.8 C)     Temp Source Oral     SpO2 99 %     Weight 220 lb (99.8 kg)     Height 5\' 8"  (1.727 m)     Head Circumference      Peak Flow      Pain Score 2     Pain Loc      Pain  Edu?      Excl. in [place]?    Updated Vital Signs BP (!) 144/103 (BP Location: Right Arm)   Pulse 99   Temp 98.3 F (36.8 C) (Oral)   Resp 18   Ht 5\' 8"  (1.727 m)   Wt 99.8 kg   SpO2 99%   BMI 33.45 kg/m   Visual Acuity Right Eye Distance:   Left Eye Distance:   Bilateral Distance:    Right Eye Near:   Left Eye Near:    Bilateral Near:     Physical Exam Vitals and nursing note reviewed.  Constitutional:      General: He is not in acute distress.    Appearance: He is not ill-appearing.  HENT:     Head: Normocephalic and atraumatic.  Eyes:     General:        Right eye: No discharge.        Left eye: No discharge.     Conjunctiva/sclera: Conjunctivae normal.  Skin:    Comments: Left middle finger -dorsum of the finger with an avulsed portion.  Copious bleeding at this time.  No nail involvement.  Neurological:     Mental Status: He is alert.  Psychiatric:        Mood and Affect: Mood normal.        Behavior: Behavior normal.    UC Treatments / Results  Labs (all labs ordered are listed, but only abnormal results are displayed) Labs Reviewed - No data to display  EKG   Radiology No results found.  Procedures Procedures (including critical care time)  Medications Ordered in UC Medications - No data to display  Initial Impression / Assessment and Plan / UC Course  I have reviewed the triage vital signs and the nursing notes.  Pertinent labs & imaging results that were available during my care of the patient were reviewed by me and considered in my medical decision making (see chart for details).    45 year old male presents with an avulsion injury to the finger.  Bleeding was controlled with use of tourniquet.  Xeroform was then applied and wound was covered with gauze and Coban. Bactroban ointment as prescribed.   Final Clinical Impressions(s) / UC Diagnoses   Final diagnoses:  Skin avulsion     Discharge Instructions     Keep dressing on until  tonight or early tomorrow AM.  Keep wound clean and covered.  Take care  Dr. Lacinda Axon    ED Prescriptions    Medication Sig Dispense Auth. Provider   mupirocin ointment (BACTROBAN) 2 % Apply 1 application topically 2 (two) times daily for 7 days. 30 g Coral Spikes, DO     PDMP not reviewed this encounter.   Coral Spikes, [place] 03/02/20 [phone]

## 2020-03-02 NOTE — Discharge Instructions (Signed)
Keep dressing on until tonight or early tomorrow AM.  Keep wound clean and covered.  Take care  Dr. Lacinda Axon

## 2020-07-01 ENCOUNTER — Other Ambulatory Visit: Payer: Self-pay

## 2020-07-01 ENCOUNTER — Ambulatory Visit
Admission: EM | Admit: 2020-07-01 | Discharge: 2020-07-01 | Disposition: A | Discharge status: Home / Self Care | Payer: 59 | Attending: Internal Medicine | Admitting: Internal Medicine

## 2020-07-01 MED ORDER — AZITHROMYCIN 250 MG PO TABS: ORAL_TABLET | ORAL | 0 refills | Status: DC

## 2020-07-01 MED ORDER — ALBUTEROL SULFATE HFA 108 (90 BASE) MCG/ACT IN AERS: 2.0000 | INHALATION_SPRAY | RESPIRATORY_TRACT | 0 refills | Status: DC | PRN

## 2020-07-01 NOTE — Discharge Instructions (Signed)
Use Flonase 2 sprays per day for 7 days

## 2020-07-01 NOTE — ED Provider Notes (Signed)
[facility]    CSN: [id number] Arrival date & time: 07/01/20  1559      History   Chief Complaint Chief Complaint  Patient presents with  . Nasal Congestion    HPI Christopher Small is a 45 y.o. male who presents with URI x 2 weeks and now has R ear fullness. Had neg covid test when he first got sick. Denies fever or body aches. Has used swimmer ear drops.     Past Medical History:  Diagnosis Date  . Anxiety   . Chicken pox   . Hyperlipidemia   . Pneumonia    newborn    Patient Active Problem List   Diagnosis Date Noted  . Annual physical exam 01/22/2019  . Family history of colonic polyps   . Polyp of colon   . Elevated CK 09/26/2018  . Prediabetes 04/13/2018  . Chest pain 01/12/2018  . Anxiety 10/13/2017  . Stress 10/13/2017  . Tendonitis of elbow, left 10/13/2017  . Muscle spasm 10/13/2017  . HLD (hyperlipidemia) 08/14/2017  . Vitamin D deficiency 08/14/2017  . Loose stools 08/14/2017  . Hoarseness 07/12/2017  . Gastroesophageal reflux disease 07/12/2017  . Belching 07/12/2017    Past Surgical History:  Procedure Laterality Date  . COLONOSCOPY WITH PROPOFOL N/A 10/22/2018   Procedure: COLONOSCOPY WITH PROPOFOL;  Surgeon: [name], MD;  Location: [facility] ENDOSCOPY;  Service: Gastroenterology;  Laterality: N/A;  . ESOPHAGOGASTRODUODENOSCOPY (EGD) WITH PROPOFOL N/A 08/07/2017   Procedure: ESOPHAGOGASTRODUODENOSCOPY (EGD) WITH PROPOFOL;  Surgeon: [name], MD;  Location: [facility] ENDOSCOPY;  Service: Gastroenterology;  Laterality: N/A;  . ROOT CANAL         Home Medications    Prior to Admission medications   Medication Sig Start Date End Date Taking? Authorizing Provider  albuterol (VENTOLIN HFA) 108 (90 Base) MCG/ACT inhaler Inhale 2 puffs into the lungs every 4 (four) hours as needed for wheezing or shortness of breath. 07/01/20  Yes Rodriguez-Southworth, Sunday Spillers, PA-C  azithromycin (ZITHROMAX Z-PAK) 250 MG tablet 2 today, then one qd x 4 days  07/01/20  Yes Rodriguez-Southworth, Sunday Spillers, PA-C  Multiple Vitamin (MULTIVITAMIN ADULT PO) Take by mouth.   Yes [provider]    Family History Family History  Problem Relation Age of Onset  . Diabetes Mother   . Hypertension Mother   . Stroke Mother   . Alcohol abuse Father   . Cancer Maternal Aunt        >type   . Diabetes Paternal Grandmother   . Heart disease Paternal Grandmother   . Cancer Paternal Grandmother        ?type cancer   . Stroke Paternal Grandfather   . Asthma Brother     Social History Social History   Tobacco Use  . Smoking status: Former Smoker    Types: Cigarettes    Quit date: 2019    Years since quitting: 3.4  . Smokeless tobacco: Never Used  . Tobacco comment: smoker since age 29 on and off now 1-2 cig/day max 1/2 ppd   Vaping Use  . Vaping Use: Never used  Substance Use Topics  . Alcohol use: Yes    Comment: social  . Drug use: Not Currently    Comment: thc sometimes      Allergies   Lipitor [atorvastatin calcium]   Review of Systems Review of Systems Sinus pressure, R ear fullness, mild cough. The rest is neg.    Physical Exam Triage Vital Signs ED Triage Vitals  Enc Vitals  Group     BP 07/01/20 1619 (!) 142/90     Pulse Rate 07/01/20 1619 77     Resp 07/01/20 1619 18     Temp 07/01/20 1619 98.4 F (36.9 C)     Temp Source 07/01/20 1619 Oral     SpO2 07/01/20 1619 97 %     Weight 07/01/20 1618 220 lb (99.8 kg)     Height 07/01/20 1618 5\' 8"  (1.727 m)     Head Circumference --      Peak Flow --      Pain Score 07/01/20 1618 0     Pain Loc --      Pain Edu? --      Excl. in [place]? --    No data found.  Updated Vital Signs BP (!) 142/90 (BP Location: Left Arm)   Pulse 77   Temp 98.4 F (36.9 C) (Oral)   Resp 18   Ht 5\' 8"  (1.727 m)   Wt 220 lb (99.8 kg)   SpO2 97%   BMI 33.45 kg/m   Visual Acuity Right Eye Distance:   Left Eye Distance:   Bilateral Distance:    Right Eye Near:   Left Eye Near:     Bilateral Near:     Physical Exam Physical Exam Vitals reviewed.  Constitutional:      General: He is not in acute distress.    Appearance: He is not toxic-appearing.  HENT:     Head: Normocephalic.     Right Ear: Ear canal and external ear normal.     Left Ear: Tympanic membrane, ear canal and external ear normal.     Ears:     Comments: R TM dull, gray and has cloudy matter behind it    Nose: Congestion present.     Comments: Mild tenderness on ethmoid and frontal sinuses present    Mouth/Throat:     Mouth: Mucous membranes are moist.     Pharynx: Oropharynx is clear.  Eyes:     General: No scleral icterus.    Conjunctiva/sclera: Conjunctivae normal.  Pulmonary:     Effort: Pulmonary effort is normal.     Breath sounds: Wheezing present.     Comments: Which resolved after coughing Musculoskeletal:        General: Normal range of motion.     Cervical back: Neck supple.  Skin:    General: Skin is warm and dry.  Neurological:     Mental Status: He is alert and oriented to person, place, and time.     Gait: Gait normal.  Psychiatric:        Mood and Affect: Mood normal.        Behavior: Behavior normal.        Thought Content: Thought content normal.        Judgment: Judgment normal.     UC Treatments / Results  Labs (all labs ordered are listed, but only abnormal results are displayed) Labs Reviewed - No data to display  EKG   Radiology No results found.  Procedures Procedures (including critical care time)  Medications Ordered in UC Medications - No data to display  Initial Impression / Assessment and Plan / UC Course  I have reviewed the triage vital signs and the nursing notes. Has R SOM and Bronchitis. I placed him on Albuterol and Zpack and advised him to use flonase. See instructions.  Final Clinical Impressions(s) / UC Diagnoses   Final diagnoses:  None  Discharge Instructions     Use Flonase 2 sprays per day for 7 days    ED  Prescriptions    Medication Sig Dispense Auth. Provider   albuterol (VENTOLIN HFA) 108 (90 Base) MCG/ACT inhaler Inhale 2 puffs into the lungs every 4 (four) hours as needed for wheezing or shortness of breath. 18 g Rodriguez-Southworth, Anquan Azzarello, PA-C   azithromycin (ZITHROMAX Z-PAK) 250 MG tablet 2 today, then one qd x 4 days 6 tablet Rodriguez-Southworth, Sunday Spillers, PA-C     PDMP not reviewed this encounter.   Shelby Mattocks, PA-C 07/01/20 1709

## 2020-07-01 NOTE — ED Triage Notes (Signed)
Patient states that he has nasal congestion, right ear pain, sinus pain x 2 weeks.

## 2020-08-24 IMAGING — CR DG CHEST 2V
1 series · 2 of 2 positions shown · non-contrast
Comparison: 04/02/2017

CLINICAL DATA: Left chest pain, shortness of breath and cough

EXAM:
CHEST - 2 VIEW

[Series 1: dg chest 2 view · 0.14mm/px · 2 of 2 slices shown]
[im 1/2]
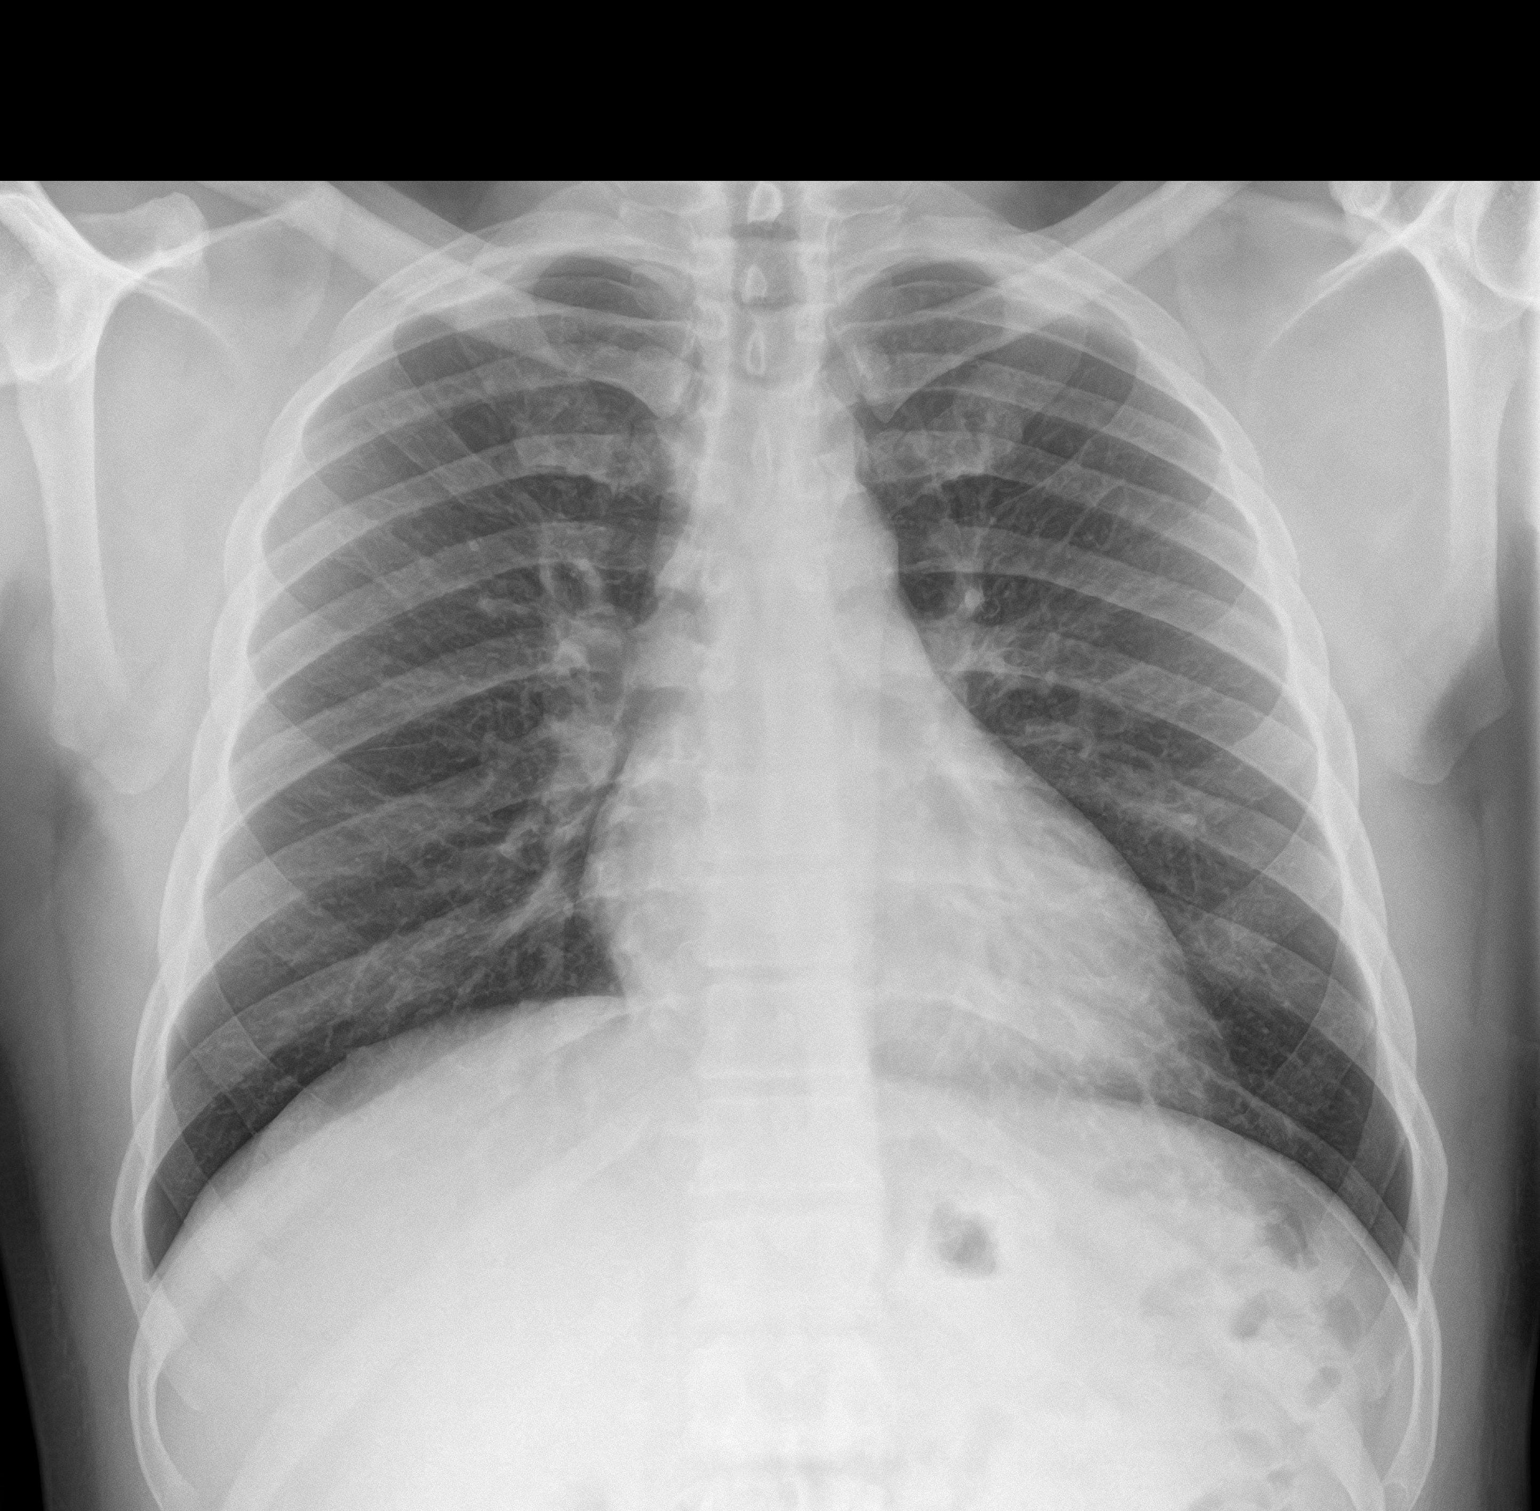
[im 2/2]
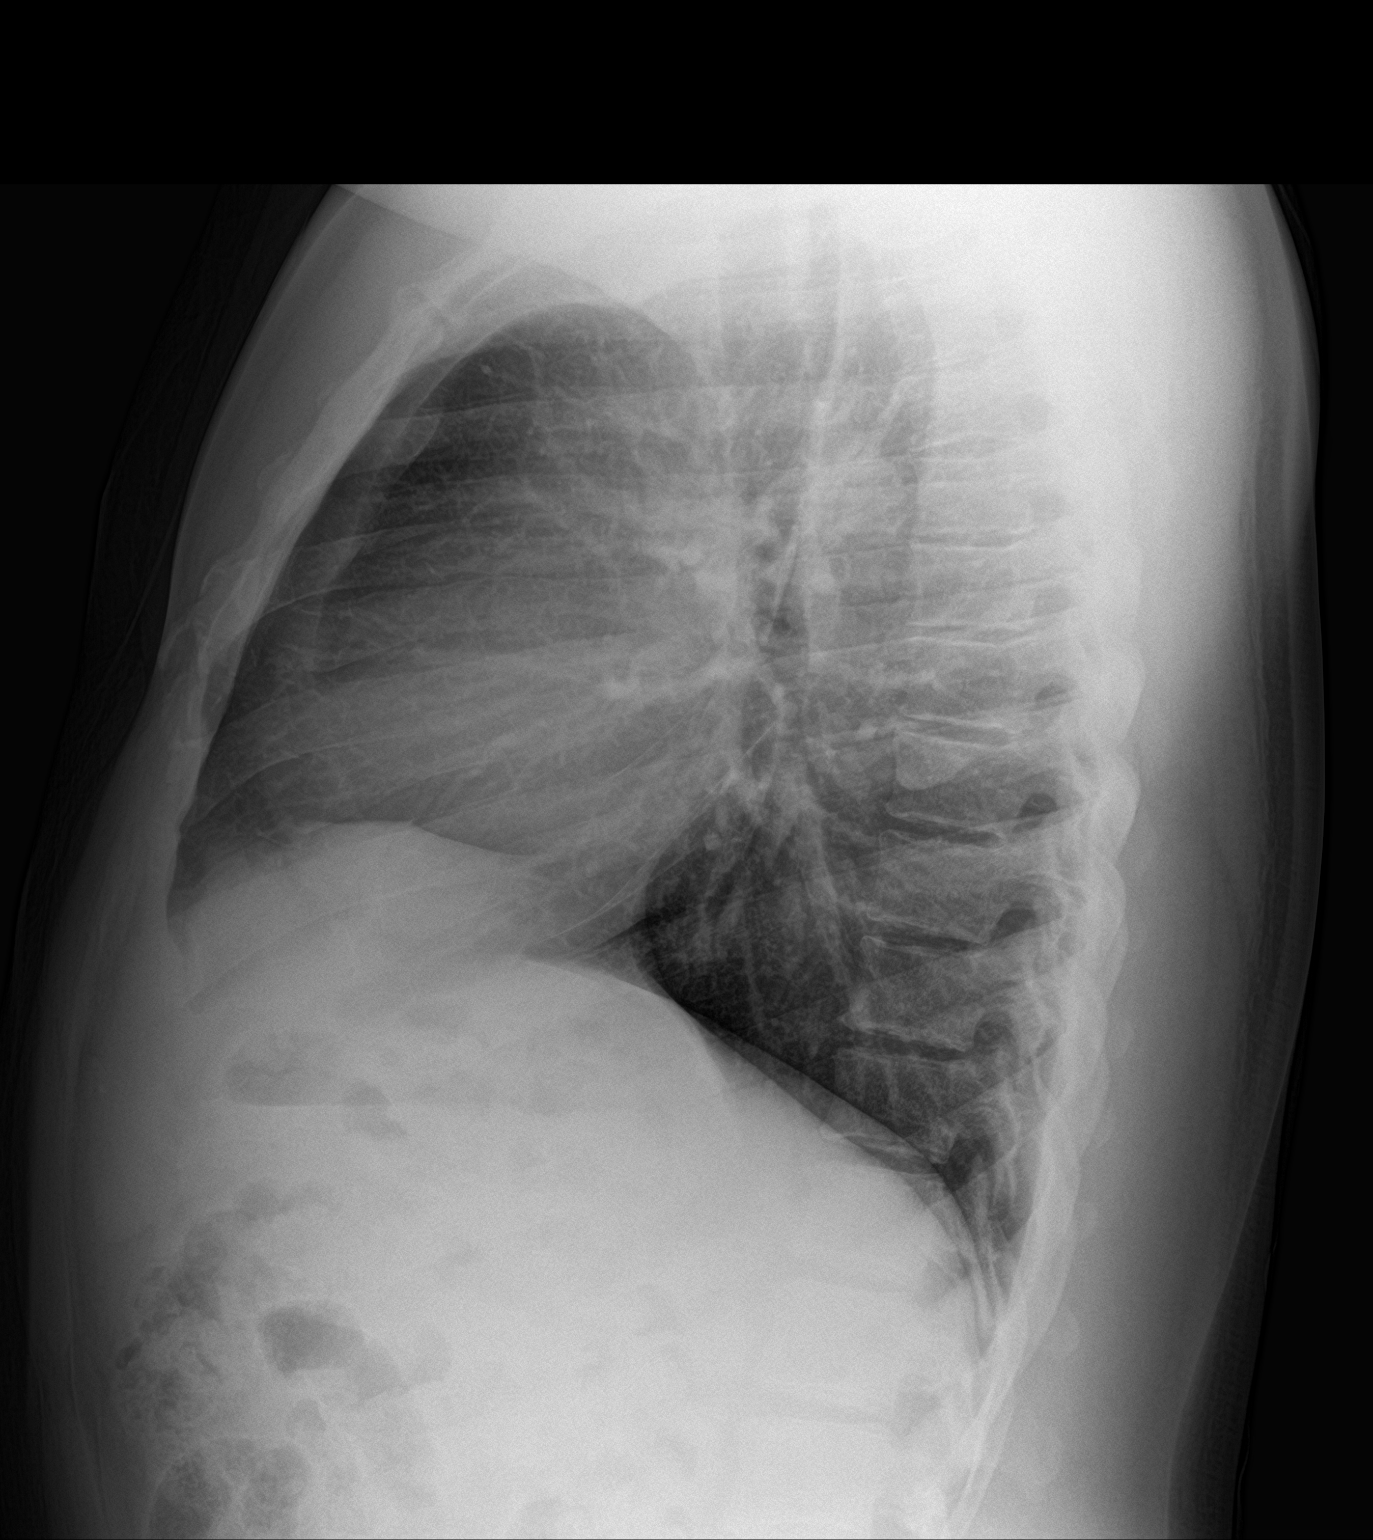

[2 of 2 positions shown; findings below may reference images not displayed]

FINDINGS: The heart size and mediastinal contours are within normal limits.
Both lungs are clear. The visualized skeletal structures are
unremarkable.
IMPRESSION: No active cardiopulmonary disease.

## 2020-09-01 ENCOUNTER — Ambulatory Visit: Payer: 59 | Admitting: Internal Medicine

## 2020-09-01 ENCOUNTER — Other Ambulatory Visit: Payer: Self-pay

## 2020-09-01 ENCOUNTER — Encounter: Payer: Self-pay | Admitting: Internal Medicine

## 2020-09-01 VITALS — BP 122/80 | HR 87 | Temp 97.4°F | Ht 68.0 in | Wt 230.6 lb

## 2020-09-01 DIAGNOSIS — Z1329 Encounter for screening for other suspected endocrine disorder: Secondary | ICD-10-CM | POA: Diagnosis not present

## 2020-09-01 DIAGNOSIS — R253 Fasciculation: Secondary | ICD-10-CM

## 2020-09-01 DIAGNOSIS — Z0000 Encounter for general adult medical examination without abnormal findings: Secondary | ICD-10-CM

## 2020-09-01 DIAGNOSIS — Z23 Encounter for immunization: Secondary | ICD-10-CM

## 2020-09-01 DIAGNOSIS — F5104 Psychophysiologic insomnia: Secondary | ICD-10-CM

## 2020-09-01 DIAGNOSIS — Z1389 Encounter for screening for other disorder: Secondary | ICD-10-CM

## 2020-09-01 DIAGNOSIS — Z125 Encounter for screening for malignant neoplasm of prostate: Secondary | ICD-10-CM

## 2020-09-01 DIAGNOSIS — H547 Unspecified visual loss: Secondary | ICD-10-CM

## 2020-09-01 DIAGNOSIS — R7303 Prediabetes: Secondary | ICD-10-CM

## 2020-09-01 DIAGNOSIS — E785 Hyperlipidemia, unspecified: Secondary | ICD-10-CM | POA: Diagnosis not present

## 2020-09-01 MED ORDER — TRAZODONE HCL 50 MG PO TABS: 25.0000 mg | ORAL_TABLET | Freq: Every evening | ORAL | 5 refills | Status: DC | PRN

## 2020-09-01 NOTE — Progress Notes (Signed)
Chief Complaint  Patient presents with   Annual Exam   Annual  1. Hld prev did not tolerate statins lipitor due to elevated CK and muscle weakness  2. C/o nerves jumping in body and needle like sensation in body worse since having covid 06/2020  3. 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 Reduced vision reading fine print he will sch eye exam wife goes to [facility] in [place]  5. Had covid shot will call back with dates covid had 06/2020 and Tdap due will get today 6. Hoarseness improved after surgery on vocal cords 1+ years ago 06/07/19 had surgery [facility] Dr. 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  Jun 07, 2019: The patient underwent MDL with microflap excision of bilateral vocal fold lesions and decadron injections. Intraoperative findings included bilateral mid cord pedunculated lesions and some intracordal fibrosis per Dr. Beverely Risen note [facility]    Review of Systems  Constitutional:  Positive for malaise/fatigue. Negative for weight loss.  HENT:  Negative for hearing loss.   Eyes:  Negative for blurred vision.  Respiratory:  Negative for shortness of breath.   Cardiovascular:  Negative for chest pain.  Gastrointestinal:  Negative for abdominal pain.  Musculoskeletal:  Negative for falls and joint pain.  Skin:  Negative for rash.  Neurological:  Positive for sensory change.       Muscle twitching  Psychiatric/Behavioral:  The patient has insomnia.   Past Medical History:  Diagnosis Date   Anxiety    Chicken pox    COVID-19    06/2020   Hyperlipidemia    Muscle tension dysphonia    ent [facility] 05/2019 Dr. Beverely Risen   Pneumonia    newborn   Sinusitis    Vocal cord polyps    Vocal cord polyps    Past  Surgical History:  Procedure Laterality Date   COLONOSCOPY WITH PROPOFOL N/A 10/22/2018   Procedure: COLONOSCOPY WITH PROPOFOL;  Surgeon: [name], MD;  Location: [facility] ENDOSCOPY;  Service: Gastroenterology;  Laterality: N/A;   ESOPHAGOGASTRODUODENOSCOPY (EGD) WITH PROPOFOL N/A 08/07/2017   Procedure: ESOPHAGOGASTRODUODENOSCOPY (EGD) WITH PROPOFOL;  Surgeon: [name], MD;  Location: [facility] ENDOSCOPY;  Service: Gastroenterology;  Laterality: N/A;   removal vocal cord polyps     06/07/19 [facility] ent Dr. Beverely Risen   ROOT CANAL     Family History  Problem Relation Age of Onset   Diabetes Mother    Hypertension Mother    Stroke Mother    Alcohol abuse Father    Asthma Brother    Diabetes Paternal Grandmother    Heart disease Paternal Grandmother    Cancer Paternal Grandmother        ?type cancer    Stroke Paternal Grandfather    Cancer Maternal Aunt        >type    Stroke Maternal Uncle    Diabetes Maternal Uncle    Social History   Socioeconomic History   Marital status: Married    Spouse name: Not on file   Number of children: Not on file   Years of education: Not on file   Highest education level: Not on file  Occupational History   Not on file  Tobacco Use  Smoking status: Former    Types: Cigarettes    Quit date: 2019    Years since quitting: 3.6   Smokeless tobacco: Never   Tobacco comments:    smoker since age 45 on and off now 1-2 cig/day max 1/2 ppd   Vaping Use   Vaping Use: Never used  Substance and Sexual Activity   Alcohol use: Yes    Comment: social   Drug use: Not Currently    Comment: thc sometimes    Sexual activity: Yes  Other Topics Concern   Not on file  Social History Narrative   Married    Former Nature conservation officer    Some college Programmer, applications    2 brothers    Social Determinants of Radio broadcast assistant Strain: Not on file  Food Insecurity: Not on file  Transportation Needs: Not on file  Physical Activity: Not on file  Stress:  Not on file  Social Connections: Not on file  Intimate Partner Violence: Not on file   Current Meds  Medication Sig   Multiple Vitamin (MULTIVITAMIN ADULT PO) Take by mouth.   traZODone (DESYREL) 50 MG tablet Take 0.5-1 tablets (25-50 mg total) by mouth at bedtime as needed for sleep.   Allergies  Allergen Reactions   Lipitor [Atorvastatin Calcium]     Muscle weakness and pain and elevated CK   Recent Results (from the past 2160 hour(s))  PSA     Status: None   Collection Time: 09/10/20  7:34 AM  Result Value Ref Range   PSA 0.60 0.10 - 4.00 ng/mL    Comment: Test performed using Access Hybritech PSA Assay, a parmagnetic partical, chemiluminecent immunoassay.  Hemoglobin A1c     Status: None   Collection Time: 09/10/20  7:34 AM  Result Value Ref Range   Hgb A1c MFr Bld 6.3 4.6 - 6.5 %    Comment: Glycemic Control Guidelines for People with Diabetes:Non Diabetic:  <6%Goal of Therapy: <7%Additional Action Suggested:  >8%   TSH     Status: None   Collection Time: 09/10/20  7:34 AM  Result Value Ref Range   TSH 2.45 0.35 - 5.50 uIU/mL  CBC with Differential/Platelet     Status: Abnormal   Collection Time: 09/10/20  7:34 AM  Result Value Ref Range   WBC 7.7 4.0 - 10.5 K/uL   RBC 5.34 4.22 - 5.81 Mil/uL   Hemoglobin 14.6 13.0 - 17.0 g/dL   HCT 43.2 39.0 - 52.0 %   MCV 80.9 78.0 - 100.0 fl   MCHC 33.8 30.0 - 36.0 g/dL   RDW 14.9 11.5 - 15.5 %   Platelets 301.0 150.0 - 400.0 K/uL   Neutrophils Relative % 45.0 43.0 - 77.0 %   Lymphocytes Relative 46.4 (H) 12.0 - 46.0 %   Monocytes Relative 5.9 3.0 - 12.0 %   Eosinophils Relative 2.5 0.0 - 5.0 %   Basophils Relative 0.2 0.0 - 3.0 %   Neutro Abs 3.5 1.4 - 7.7 K/uL   Lymphs Abs 3.6 0.7 - 4.0 K/uL   Monocytes Absolute 0.5 0.1 - 1.0 K/uL   Eosinophils Absolute 0.2 0.0 - 0.7 K/uL   Basophils Absolute 0.0 0.0 - 0.1 K/uL  Lipid panel     Status: Abnormal   Collection Time: 09/10/20  7:34 AM  Result Value Ref Range   Cholesterol  236 (H) 0 - 200 mg/dL    Comment: ATP III Classification       Desirable:  <  200 mg/dL               Borderline High:  200 - 239 mg/dL          High:  > = 240 mg/dL   Triglycerides 263.0 (H) 0.0 - 149.0 mg/dL    Comment: Normal:  <150 mg/dLBorderline High:  150 - 199 mg/dL   HDL 30.30 (L) >39.00 mg/dL   VLDL 52.6 (H) 0.0 - 40.0 mg/dL   Total CHOL/HDL Ratio 8     Comment:                Men          Women1/2 Average Risk     3.4          3.3Average Risk          5.0          4.42X Average Risk          9.6          7.13X Average Risk          15.0          11.0                       NonHDL 205.32     Comment: NOTE:  Non-HDL goal should be 30 mg/dL higher than patient's LDL goal (i.e. LDL goal of < 70 mg/dL, would have non-HDL goal of < 100 mg/dL)  Comprehensive metabolic panel     Status: None   Collection Time: 09/10/20  7:34 AM  Result Value Ref Range   Sodium 140 135 - 145 mEq/L   Potassium 4.2 3.5 - 5.1 mEq/L   Chloride 107 96 - 112 mEq/L   CO2 25 19 - 32 mEq/L   Glucose, Bld 93 70 - 99 mg/dL   BUN 16 6 - 23 mg/dL   Creatinine, Ser 1.17 0.40 - 1.50 mg/dL   Total Bilirubin 0.6 0.2 - 1.2 mg/dL   Alkaline Phosphatase 72 39 - 117 U/L   AST 19 0 - 37 U/L   ALT 37 0 - 53 U/L   Total Protein 6.7 6.0 - 8.3 g/dL   Albumin 4.8 3.5 - 5.2 g/dL   GFR 74.44 >60.00 mL/min    Comment: Calculated using the CKD-EPI Creatinine Equation (2021)   Calcium 10.0 8.4 - 10.5 mg/dL  LDL cholesterol, direct     Status: None   Collection Time: 09/10/20  7:34 AM  Result Value Ref Range   Direct LDL 166.0 mg/dL    Comment: Optimal:  <100 mg/dLNear or Above Optimal:  100-129 mg/dLBorderline High:  130-159 mg/dLHigh:  160-189 mg/dLVery High:  >190 mg/dL   Objective  Body mass index is 35.06 kg/m. Wt Readings from Last 3 Encounters:  09/01/20 230 lb 9.6 oz (104.6 kg)  07/01/20 220 lb (99.8 kg)  03/02/20 220 lb (99.8 kg)   Temp Readings from Last 3 Encounters:  09/01/20 (!) 97.4 F (36.3 C) (Temporal)   07/01/20 98.4 F (36.9 C) (Oral)  03/02/20 98.3 F (36.8 C) (Oral)   BP Readings from Last 3 Encounters:  09/01/20 122/80  07/01/20 (!) 142/90  03/02/20 (!) 144/103   Pulse Readings from Last 3 Encounters:  09/01/20 87  07/01/20 77  03/02/20 99    Physical Exam Vitals and nursing note reviewed.  Constitutional:      Appearance: Normal appearance. He is well-developed and well-groomed.  HENT:     Head: Normocephalic  and atraumatic.  Eyes:     Conjunctiva/sclera: Conjunctivae normal.     Pupils: Pupils are equal, round, and reactive to light.  Cardiovascular:     Rate and Rhythm: Normal rate and regular rhythm.     Heart sounds: Normal heart sounds. No murmur heard. Pulmonary:     Effort: Pulmonary effort is normal.     Breath sounds: Normal breath sounds.  Abdominal:     General: Abdomen is flat. Bowel sounds are normal.  Skin:    General: Skin is warm and dry.  Neurological:     General: No focal deficit present.     Mental Status: He is alert and oriented to person, place, and time. Mental status is at baseline.     Gait: Gait normal.  Psychiatric:        Attention and Perception: Attention and perception normal.        Mood and Affect: Mood and affect normal.        Speech: Speech normal.        Behavior: Behavior normal. Behavior is cooperative.        Thought Content: Thought content normal.        Cognition and Memory: Cognition and memory normal.        Judgment: Judgment normal.    Assessment  Plan  Annual physical exam - Plan: Comprehensive metabolic panel, Lipid panel, CBC with Differential/Platelet Flu shot utd Tdap given 09/01/20  MMR immune Consider hep B vaccine  Will call back with covid 19 vaccine dates had covid 06/2020 if not had booster consider Declines std check  Colonoscopy 10/22/18 tubular and hyperplastic polyps Dr. Vicente Males f/u in 5 years PSA recheck yearly  Prediabetes - Plan: Hemoglobin A1c 6.3 increased  Rec healthy diet and  exercise  Hyperlipidemia, unspecified hyperlipidemia type - Plan: Lipid panel Prev did not tolerate lipitor due to elevated CK and muscle weakness  Lifestyle changes  Consider have cards weight in in future for options for treatment   Chronic insomnia - Plan: traZODone (DESYREL) 25-50 MG tablet qhs prn trial Ambulatory referral to Neurology consider sleep study with snoring if they do not do this consider pulm referral Meditation app calm, insight timer, replika If trazodone doesn't help consider ativan again or lunesta for sleep  Muscle twitching - Plan: Ambulatory referral to Neurology consider EMG/NCS h/o elevated CK and muscle weakness but this was while pt was on statin lipitor  Reduced vision  -[facility] will call and establish care his wife goes there   Provider: Dr. Olivia Mackie McLean-Scocuzza-Internal Medicine

## 2020-09-01 NOTE — Patient Instructions (Addendum)
Meditation app calm, insight timer, replika If trazodone doesn't help consider ativan again or lunesta for sleep  Allegra, claritin, xyzal or zyrtec  Nasal saline/flonase   Tdap Tetanus, Diphtheria) Vaccine: What You Need to Know 1. Why get vaccinated? Td vaccine can prevent tetanus and diphtheria. Tetanus enters the body through cuts or wounds. Diphtheria spreads from person to person. TETANUS (T) causes painful stiffening of the muscles. Tetanus can lead to serious health problems, including being unable to open the mouth, having trouble swallowing and breathing, or death. DIPHTHERIA (D) can lead to difficulty breathing, heart failure, paralysis, or death. 2. Td vaccine Td is only for children 7 years and older, adolescents, and adults.  Td is usually given as a booster dose every 10 years, or after 5 years in the case of a severe or dirty wound or burn. Another vaccine, called "Tdap," may be used instead of Td. Tdap protects against pertussis, also known as "whooping cough," in addition to tetanus anddiphtheria. Td may be given at the same time as other vaccines. 3. Talk with your health care provider Tell your vaccination provider if the person getting the vaccine: Has had an allergic reaction after a previous dose of any vaccine that protects against tetanus or diphtheria, or has any severe, life-threatening allergies Has ever had Guillain-Barr Syndrome (also called "GBS") Has had severe pain or swelling after a previous dose of any vaccine that protects against tetanus or diphtheria In some cases, your health care provider may decide to postpone Td vaccinationuntil a future visit. People with minor illnesses, such as a cold, may be vaccinated. People who are moderately or severely ill should usually wait until they recover beforegetting Td vaccine.  Your health care provider can give you more information. 4. Risks of a vaccine reaction Pain, redness, or swelling where the shot was  given, mild fever, headache, feeling tired, and nausea, vomiting, diarrhea, or stomachache sometimes happen after Td vaccination. People sometimes faint after medical procedures, including vaccination. Tellyour provider if you feel dizzy or have vision changes or ringing in the ears.  As with any medicine, there is a very remote chance of a vaccine causing asevere allergic reaction, other serious injury, or death. 5. What if there is a serious problem? An allergic reaction could occur after the vaccinated person leaves the clinic. If you see signs of a severe allergic reaction (hives, swelling of the face and throat, difficulty breathing, a fast heartbeat, dizziness, or weakness), call [id number] get the person to the nearest hospital.  For other signs that concern you, call your health care provider.  Adverse reactions should be reported to the Vaccine Adverse Event Reporting System (VAERS). Your health care provider will usually file this report, or you can do it yourself. Visit the VAERS website at www.vaers.SamedayNews.es or call 623 534 2267. VAERS is only for reporting reactions, and VAERS staff members do not give medical advice. 6. The National Vaccine Injury Compensation Program The [employer] Vaccine Injury Compensation Program (VICP) is a federal program that was created to compensate people who may have been injured by certain vaccines. Claims regarding alleged injury or death due to vaccination have a time limit for filing, which may be as short as two years. Visit the VICP website at GoldCloset.com.ee or call (907) 526-8415to learn about the program and about filing a claim. 7. How can I learn more? Ask your health care provider. Call your local or state health department. Visit the website of the Food and Drug Administration (FDA) for vaccine  package inserts and additional information at TraderRating.uy. Contact the Centers for Disease Control and  Prevention (CDC): Call 551-484-2811 (1-800-CDC-INFO) or Visit CDC's website at http://hunter.com/. Vaccine Information Statement Td (Tetanus, Diphtheria) Vaccine (08/30/2019) This information is not intended to replace advice given to you by your health care provider. Make sure you discuss any questions you have with your healthcare provider. Document Revised: 10/17/2019 Document Reviewed: 10/17/2019 Elsevier Patient Education  [place].   Eustachian Tube Dysfunction  Eustachian tube dysfunction refers to a condition in which a blockage develops in the narrow passage that connects the middle ear to the back of the nose (eustachian tube). The eustachian tube regulates air pressure in the middle ear by letting air move between the ear and nose. It also helps to drain fluid from the middle earspace. Eustachian tube dysfunction can affect one or both ears. When the eustachian tube does not function properly, air pressure, fluid, or both can build up inthe middle ear. What are the causes? This condition occurs when the eustachian tube becomes blocked or cannot open normally. Common causes of this condition include: Ear infections. Colds and other infections that affect the nose, mouth, and throat (upper respiratory tract). Allergies. Irritation from cigarette smoke. Irritation from stomach acid coming up into the esophagus (gastroesophageal reflux). The esophagus is the tube that carries food from the mouth to the stomach. Sudden changes in air pressure, such as from descending in an airplane or scuba diving. Abnormal growths in the nose or throat, such as: Growths that line the nose (nasal polyps). Abnormal growth of cells (tumors). Enlarged tissue at the back of the throat (adenoids). What increases the risk? You are more likely to develop this condition if: You smoke. You are overweight. You are a child who has: Certain birth defects of the mouth, such as cleft palate. Large  tonsils or adenoids. What are the signs or symptoms? Common symptoms of this condition include: A feeling of fullness in the ear. Ear pain. Clicking or popping noises in the ear. Ringing in the ear. Hearing loss. Loss of balance. Dizziness. Symptoms may get worse when the air pressure around you changes, such as whenyou travel to an area of high elevation, fly on an airplane, or go scuba diving. How is this diagnosed? This condition may be diagnosed based on: Your symptoms. A physical exam of your ears, nose, and throat. Tests, such as those that measure: The movement of your eardrum (tympanogram). Your hearing (audiometry). How is this treated? Treatment depends on the cause and severity of your condition. In mild cases, you may relieve your symptoms by moving air into your ears. This is called "popping the ears." In more severe cases, or if you have symptoms of fluid in your ears, treatment may include: Medicines to relieve congestion (decongestants). Medicines that treat allergies (antihistamines). Nasal sprays or ear drops that contain medicines that reduce swelling (steroids). A procedure to drain the fluid in your eardrum (myringotomy). In this procedure, a small tube is placed in the eardrum to: Drain the fluid. Restore the air in the middle ear space. A procedure to insert a balloon device through the nose to inflate the opening of the eustachian tube (balloon dilation). Follow these instructions at home: Lifestyle Do not do any of the following until your health care provider approves: Travel to high altitudes. Fly in airplanes. Work in a Pension scheme manager or room. Scuba dive. Do not use any products that contain nicotine or tobacco, such as cigarettes and e-cigarettes.  If you need help quitting, ask your health care provider. Keep your ears dry. Wear fitted earplugs during showering and bathing. Dry your ears completely after. General instructions Take  over-the-counter and prescription medicines only as told by your health care provider. Use techniques to help pop your ears as recommended by your health care provider. These may include: Chewing gum. Yawning. Frequent, forceful swallowing. Closing your mouth, holding your nose closed, and gently blowing as if you are trying to blow air out of your nose. Keep all follow-up visits as told by your health care provider. This is important. Contact a health care provider if: Your symptoms do not go away after treatment. Your symptoms come back after treatment. You are unable to pop your ears. You have: A fever. Pain in your ear. Pain in your head or neck. Fluid draining from your ear. Your hearing suddenly changes. You become very dizzy. You lose your balance. Summary Eustachian tube dysfunction refers to a condition in which a blockage develops in the eustachian tube. It can be caused by ear infections, allergies, inhaled irritants, or abnormal growths in the nose or throat. Symptoms include ear pain, hearing loss, or ringing in the ears. Mild cases are treated with maneuvers to unblock the ears, such as yawning or ear popping. Severe cases are treated with medicines. Surgery may also be done (rare). This information is not intended to replace advice given to you by your health care provider. Make sure you discuss any questions you have with your healthcare provider. Document Revised: 05/02/2017 Document Reviewed: 05/02/2017 Elsevier Patient Education  [place].    Trazodone Tablets What is this medication? TRAZODONE (TRAZ oh done) treats depression. It increases the amount ofserotonin in the brain, a hormone that helps regulate mood. This medicine may be used for other purposes; ask your health care provider orpharmacist if you have questions. COMMON BRAND NAME(S): Desyrel What should I tell my care team before I take this medication? They need to know if you have any of  these conditions: Attempted suicide or thinking about it Bipolar disorder Bleeding problems Glaucoma Heart disease, or previous heart attack Irregular heart beat Kidney or liver disease Low levels of sodium in the blood An unusual or allergic reaction to trazodone, other medications, foods, dyes or preservatives Pregnant or trying to get pregnant Breast-feeding How should I use this medication? Take this medication by mouth with a glass of water. Follow the directions on the prescription label. Take this medication shortly after a meal or a light snack. Take your medication at regular intervals. Do not take your medication more often than directed. Do not stop taking this medication suddenly except upon the advice of your care team. Stopping this medication too quickly maycause serious side effects or your condition may worsen. A special MedGuide will be given to you by the pharmacist with eachprescription and refill. Be sure to read this information carefully each time. Talk to your care team regarding the use of this medication in children.Special care may be needed. Overdosage: If you think you have taken too much of this medicine contact apoison control center or emergency room at once. NOTE: This medicine is only for you. Do not share this medicine with others. What if I miss a dose? If you miss a dose, take it as soon as you can. If it is almost time for yournext dose, take only that dose. Do not take double or extra doses. What may interact with this medication? Do not take  this medication with any of the following: Certain medications for fungal infections like fluconazole, itraconazole, ketoconazole, posaconazole, voriconazole Cisapride Dronedarone Linezolid MAOIs like Carbex, Eldepryl, Marplan, Nardil, and Parnate Mesoridazine Methylene blue (injected into a vein) Pimozide Saquinavir Thioridazine This medication may also interact with the following: Alcohol Antiviral  medications for HIV or AIDS Aspirin and aspirin-like medications Barbiturates like phenobarbital Certain medications for blood pressure, heart disease, irregular heart beat Certain medications for depression, anxiety, or psychotic disturbances Certain medications for migraine headache like almotriptan, eletriptan, frovatriptan, naratriptan, rizatriptan, sumatriptan, zolmitriptan Certain medications for seizures like carbamazepine and phenytoin Certain medications for sleep Certain medications that treat or prevent blood clots like dalteparin, enoxaparin, warfarin Digoxin Fentanyl Lithium NSAIDS, medications for pain and inflammation, like ibuprofen or naproxen Other medications that prolong the QT interval (cause an abnormal heart rhythm) like dofetilide Rasagiline Supplements like St. John's wort, kava kava, valerian Tramadol Tryptophan This list may not describe all possible interactions. Give your health care provider a list of all the medicines, herbs, non-prescription drugs, or dietary supplements you use. Also tell them if you smoke, drink alcohol, or use illegaldrugs. Some items may interact with your medicine. What should I watch for while using this medication? Tell your care team if your symptoms do not get better or if they get worse. Visit your care team for regular checks on your progress. Because it may take several weeks to see the full effects of this medication, it is important tocontinue your treatment as prescribed by your care team. Watch for new or worsening thoughts of suicide or depression. This includes sudden changes in mood, behaviors, or thoughts. These changes can happen at any time but are more common in the beginning of treatment or after a change in dose. Call your care team right away if you experience these thoughts orworsening depression. Manic episodes may happen in patients with bipolar disorder who take this medication. Watch for changes in feelings or  behaviors such as feeling anxious, nervous, agitated, panicky, irritable, hostile, aggressive, impulsive, severely restless, overly excited and hyperactive, or trouble sleeping. These changes can happen at any time but are more common in the beginning of treatment or after a change in dose. Call your care team right away if you notice any ofthese symptoms. You may get drowsy or dizzy. Do not drive, use machinery, or do anything that needs mental alertness until you know how this medication affects you. Do not stand or sit up quickly, especially if you are an older patient. This reduces the risk of dizzy or fainting spells. Alcohol may interfere with the effect ofthis medication. Avoid alcoholic drinks. This medication may cause dry eyes and blurred vision. If you wear contact lenses you may feel some discomfort. Lubricating drops may help. See your eyedoctor if the problem does not go away or is severe. Your mouth may get dry. Chewing sugarless gum, sucking hard candy and drinking plenty of water may help. Contact your care team if the problem does not goaway or is severe. What side effects may I notice from receiving this medication? Side effects that you should report to your care team as soon as possible: Allergic reactions-skin rash, itching, hives, swelling of the face, lips, tongue, or throat Bleeding-bloody or black, tar-like stools, red or dark brown urine, vomiting blood or brown material that looks like coffee grounds, small, red or purple spots on skin, unusual bleeding or bruising Heart rhythm changes-fast or irregular heartbeat, dizziness, feeling faint or lightheaded, chest pain, trouble breathing  Low blood pressure-dizziness, feeling faint or lightheaded, blurry vision Low sodium level-muscle weakness, fatigue, dizziness, headache, confusion Prolonged or painful erection Serotonin syndrome-irritability, confusion, fast or irregular heartbeat, muscle stiffness, twitching muscles, sweating,  high fever, seizures, chills, vomiting, diarrhea Sudden eye pain or change in vision such as blurry vision, seeing halos around lights, vision loss Thoughts of suicide or self-harm, worsening mood, feelings of depression Side effects that usually do not require medical attention (report to your careteam if they continue or are bothersome): Change in sex drive or performance Constipation Dizziness Drowsiness Dry mouth This list may not describe all possible side effects. Call your doctor for medical advice about side effects. You may report side effects to FDA at1-800-FDA-1088. Where should I keep my medication? Keep out of the reach of children and pets. Store at room temperature between 15 and 30 degrees C (59 to 86 degrees F). Protect from light. Keep container tightly closed. Throw away any unusedmedication after the expiration date. NOTE: This sheet is a summary. It may not cover all possible information. If you have questions about this medicine, talk to your doctor, pharmacist, orhealth care provider.  2022 Elsevier/Gold Standard (2019-12-02 14:46:11)   Insomnia Insomnia is a sleep disorder that makes it difficult to fall asleep or stay asleep. Insomnia can cause fatigue, low energy, difficulty concentrating, moodswings, and poor performance at work or school. There are three different ways to classify insomnia: Difficulty falling asleep. Difficulty staying asleep. Waking up too early in the morning. Any type of insomnia can be long-term (chronic) or short-term (acute). Both are common. Short-term insomnia usually lasts for three months or less. Chronic insomnia occurs at least three times a week for longer than threemonths. What are the causes? Insomnia may be caused by another condition, situation, or substance, such as: Anxiety. Certain medicines. Gastroesophageal reflux disease (GERD) or other gastrointestinal conditions. Asthma or other breathing conditions. Restless legs  syndrome, sleep apnea, or other sleep disorders. Chronic pain. Menopause. Stroke. Abuse of alcohol, tobacco, or illegal drugs. Mental health conditions, such as depression. Caffeine. Neurological disorders, such as Alzheimer's disease. An overactive thyroid (hyperthyroidism). Sometimes, the cause of insomnia may not be known. What increases the risk? Risk factors for insomnia include: Gender. Women are affected more often than men. Age. Insomnia is more common as you get older. Stress. Lack of exercise. Irregular work schedule or working night shifts. Traveling between different time zones. Certain medical and mental health conditions. What are the signs or symptoms? If you have insomnia, the main symptom is having trouble falling asleep or having trouble staying asleep. This may lead to other symptoms, such as: Feeling fatigued or having low energy. Feeling nervous about going to sleep. Not feeling rested in the morning. Having trouble concentrating. Feeling irritable, anxious, or depressed. How is this diagnosed? This condition may be diagnosed based on: Your symptoms and medical history. Your health care provider may ask about: Your sleep habits. Any medical conditions you have. Your mental health. A physical exam. How is this treated? Treatment for insomnia depends on the cause. Treatment may focus on treating an underlying condition that is causing insomnia. Treatment may also include: Medicines to help you sleep. Counseling or therapy. Lifestyle adjustments to help you sleep better. Follow these instructions at home: Eating and drinking  Limit or avoid alcohol, caffeinated beverages, and cigarettes, especially close to bedtime. These can disrupt your sleep. Do not eat a large meal or eat spicy foods right before bedtime. This can lead to digestive  discomfort that can make it hard for you to sleep.  Sleep habits  Keep a sleep diary to help you and your health care  provider figure out what could be causing your insomnia. Write down: When you sleep. When you wake up during the night. How well you sleep. How rested you feel the next day. Any side effects of medicines you are taking. What you eat and drink. Make your bedroom a dark, comfortable place where it is easy to fall asleep. Put up shades or blackout curtains to block light from outside. Use a white noise machine to block noise. Keep the temperature cool. Limit screen use before bedtime. This includes: Watching TV. Using your smartphone, tablet, or computer. Stick to a routine that includes going to bed and waking up at the same times every day and night. This can help you fall asleep faster. Consider making a quiet activity, such as reading, part of your nighttime routine. Try to avoid taking naps during the day so that you sleep better at night. Get out of bed if you are still awake after 15 minutes of trying to sleep. Keep the lights down, but try reading or doing a quiet activity. When you feel sleepy, go back to bed.  General instructions Take over-the-counter and prescription medicines only as told by your health care provider. Exercise regularly, as told by your health care provider. Avoid exercise starting several hours before bedtime. Use relaxation techniques to manage stress. Ask your health care provider to suggest some techniques that may work well for you. These may include: Breathing exercises. Routines to release muscle tension. Visualizing peaceful scenes. Make sure that you drive carefully. Avoid driving if you feel very sleepy. Keep all follow-up visits as told by your health care provider. This is important. Contact a health care provider if: You are tired throughout the day. You have trouble in your daily routine due to sleepiness. You continue to have sleep problems, or your sleep problems get worse. Get help right away if: You have serious thoughts about hurting  yourself or someone else. If you ever feel like you may hurt yourself or others, or have thoughts about taking your own life, get help right away. You can go to your nearest emergency department or call: Your local emergency services (911 in the [place] A suicide crisis helpline, such as the [facility] at (740) 189-8332. This is open 24 hours a day. Summary Insomnia is a sleep disorder that makes it difficult to fall asleep or stay asleep. Insomnia can be long-term (chronic) or short-term (acute). Treatment for insomnia depends on the cause. Treatment may focus on treating an underlying condition that is causing insomnia. Keep a sleep diary to help you and your health care provider figure out what could be causing your insomnia. This information is not intended to replace advice given to you by your health care provider. Make sure you discuss any questions you have with your healthcare provider. Document Revised: 11/21/2019 Document Reviewed: 11/21/2019 Elsevier Patient Education  2022 [employer].

## 2020-09-10 ENCOUNTER — Other Ambulatory Visit: Payer: Self-pay

## 2020-09-10 ENCOUNTER — Other Ambulatory Visit (INDEPENDENT_AMBULATORY_CARE_PROVIDER_SITE_OTHER): Payer: 59

## 2020-09-10 ENCOUNTER — Other Ambulatory Visit: Payer: Self-pay | Admitting: Family

## 2020-09-10 DIAGNOSIS — Z0000 Encounter for general adult medical examination without abnormal findings: Secondary | ICD-10-CM | POA: Diagnosis not present

## 2020-09-10 DIAGNOSIS — Z1329 Encounter for screening for other suspected endocrine disorder: Secondary | ICD-10-CM

## 2020-09-10 DIAGNOSIS — Z1389 Encounter for screening for other disorder: Secondary | ICD-10-CM

## 2020-09-10 DIAGNOSIS — R7303 Prediabetes: Secondary | ICD-10-CM | POA: Diagnosis not present

## 2020-09-10 DIAGNOSIS — Z125 Encounter for screening for malignant neoplasm of prostate: Secondary | ICD-10-CM | POA: Diagnosis not present

## 2020-09-10 DIAGNOSIS — E785 Hyperlipidemia, unspecified: Secondary | ICD-10-CM | POA: Diagnosis not present

## 2020-09-10 LAB — COMPREHENSIVE METABOLIC PANEL
ALT: 37 U/L (ref 0–53)
AST: 19 U/L (ref 0–37)
Albumin: 4.8 g/dL (ref 3.5–5.2)
Alkaline Phosphatase: 72 U/L (ref 39–117)
BUN: 16 mg/dL (ref 6–23)
CO2: 25 mEq/L (ref 19–32)
Calcium: 10 mg/dL (ref 8.4–10.5)
Chloride: 107 mEq/L (ref 96–112)
Creatinine, Ser: 1.17 mg/dL (ref 0.40–1.50)
GFR: 74.44 mL/min (ref >60.00)
Glucose, Bld: 93 mg/dL (ref 70–99)
Potassium: 4.2 mEq/L (ref 3.5–5.1)
Sodium: 140 mEq/L (ref 135–145)
Total Bilirubin: 0.6 mg/dL (ref 0.2–1.2)
Total Protein: 6.7 g/dL (ref 6.0–8.3)

## 2020-09-10 LAB — CBC WITH DIFFERENTIAL/PLATELET
Basophils Absolute: 0 10*3/uL (ref 0.0–0.1)
Basophils Relative: 0.2 % (ref 0.0–3.0)
Eosinophils Absolute: 0.2 10*3/uL (ref 0.0–0.7)
Eosinophils Relative: 2.5 % (ref 0.0–5.0)
HCT: 43.2 % (ref 39.0–52.0)
Hemoglobin: 14.6 g/dL (ref 13.0–17.0)
Lymphocytes Relative: 46.4 % — ABNORMAL HIGH (ref 12.0–46.0)
Lymphs Abs: 3.6 10*3/uL (ref 0.7–4.0)
MCHC: 33.8 g/dL (ref 30.0–36.0)
MCV: 80.9 fl (ref 78.0–100.0)
Monocytes Absolute: 0.5 10*3/uL (ref 0.1–1.0)
Monocytes Relative: 5.9 % (ref 3.0–12.0)
Neutro Abs: 3.5 10*3/uL (ref 1.4–7.7)
Neutrophils Relative %: 45 % (ref 43.0–77.0)
Platelets: 301 10*3/uL (ref 150.0–400.0)
RBC: 5.34 Mil/uL (ref 4.22–5.81)
RDW: 14.9 % (ref 11.5–15.5)
WBC: 7.7 10*3/uL (ref 4.0–10.5)

## 2020-09-10 LAB — LIPID PANEL
Cholesterol: 236 mg/dL — ABNORMAL HIGH (ref 0–200)
HDL: 30.3 mg/dL — ABNORMAL LOW (ref >39.00)
NonHDL: 205.32
Total CHOL/HDL Ratio: 8
Triglycerides: 263 mg/dL — ABNORMAL HIGH (ref 0.0–149.0)
VLDL: 52.6 mg/dL — ABNORMAL HIGH (ref 0.0–40.0)

## 2020-09-10 LAB — HEMOGLOBIN A1C: Hgb A1c MFr Bld: 6.3 % (ref 4.6–6.5)

## 2020-09-10 LAB — LDL CHOLESTEROL, DIRECT: Direct LDL: 166 mg/dL

## 2020-09-10 LAB — TSH: TSH: 2.45 u[IU]/mL (ref 0.35–5.50)

## 2020-09-10 LAB — PSA: PSA: 0.6 ng/mL (ref 0.10–4.00)

## 2020-09-11 ENCOUNTER — Encounter: Payer: Self-pay | Admitting: Internal Medicine

## 2020-09-11 ENCOUNTER — Telehealth: Payer: Self-pay

## 2020-09-11 DIAGNOSIS — E785 Hyperlipidemia, unspecified: Secondary | ICD-10-CM

## 2020-09-11 DIAGNOSIS — R7303 Prediabetes: Secondary | ICD-10-CM

## 2020-09-11 LAB — URINALYSIS, ROUTINE W REFLEX MICROSCOPIC
Bilirubin Urine: NEGATIVE
Glucose, UA: NEGATIVE
Hgb urine dipstick: NEGATIVE
Ketones, ur: NEGATIVE
Leukocytes,Ua: NEGATIVE
Nitrite: NEGATIVE
Protein, ur: NEGATIVE
Specific Gravity, Urine: 1.018 (ref 1.001–1.035)
pH: 5.5 (ref 5.0–8.0)

## 2020-09-11 MED ORDER — SIMVASTATIN 20 MG PO TABS: 20.0000 mg | ORAL_TABLET | Freq: Every day | ORAL | 1 refills | Status: DC

## 2020-09-11 NOTE — Telephone Encounter (Signed)
Orders placed for fasting labs for recheck in months. Simvastatin 20 mg sent to pharmacy.

## 2021-01-22 ENCOUNTER — Encounter (HOSPITAL_BASED_OUTPATIENT_CLINIC_OR_DEPARTMENT_OTHER): Payer: Self-pay

## 2021-01-22 DIAGNOSIS — R0989 Other specified symptoms and signs involving the circulatory and respiratory systems: Secondary | ICD-10-CM | POA: Insufficient documentation

## 2021-01-22 DIAGNOSIS — R000 Tachycardia, unspecified: Secondary | ICD-10-CM | POA: Insufficient documentation

## 2021-02-09 ENCOUNTER — Ambulatory Visit (HOSPITAL_BASED_OUTPATIENT_CLINIC_OR_DEPARTMENT_OTHER): Payer: 59 | Admitting: Internal Medicine

## 2021-02-09 ENCOUNTER — Other Ambulatory Visit: Payer: Self-pay

## 2021-02-09 ENCOUNTER — Encounter (HOSPITAL_BASED_OUTPATIENT_CLINIC_OR_DEPARTMENT_OTHER): Payer: Self-pay | Admitting: Internal Medicine

## 2021-02-09 VITALS — BP 115/78 | HR 90 | Ht 69.0 in | Wt 232.2 lb

## 2021-02-09 DIAGNOSIS — T466X5A Adverse effect of antihyperlipidemic and antiarteriosclerotic drugs, initial encounter: Secondary | ICD-10-CM

## 2021-02-09 DIAGNOSIS — E785 Hyperlipidemia, unspecified: Secondary | ICD-10-CM | POA: Diagnosis not present

## 2021-02-09 DIAGNOSIS — G720 Drug-induced myopathy: Secondary | ICD-10-CM | POA: Diagnosis not present

## 2021-02-09 NOTE — Progress Notes (Signed)
LIPID CLINIC CONSULT NOTE  Chief Complaint:  Manage dyslipidemia  Primary Care Physician: McLean-Scocuzza, Nino Glow, MD  Primary Cardiologist:  None  HPI:  Christopher Small is a 46 y.o. male who is being seen today for the evaluation of dyslipidemia at the request of Kennyth Arnold, FNP.  This is a pleasant 46 year old male is kindly referred for evaluation management of dyslipidemia.  He was previously in the [employer] and [employer] and had worked as a Runner, broadcasting/film/video.  He has a history of stroke in his mother and pacemaker in his maternal grandmother.  He also has a history of high cholesterol most recently with total cholesterol 236, triglycerides 263, HDL 30 and LDL 166.  In the spring was started on atorvastatin but had marked elevation in his CK also pain.  It appears his CK has been elevated at periods as high as 629 in the past and more recently 2006 on statin, improved to 490.  He was then switched to simvastatin which he says has been on since August and seems to be tolerating.  He has not had reassessment of his CK or cholesterol since then.  PMHx:  Past Medical History:  Diagnosis Date   Anxiety    Chicken pox    COVID-19    06/2020   Hyperlipidemia    Muscle tension dysphonia    [facility] 05/2019 Dr. Beverely Risen   Pneumonia    newborn   Sinusitis    Vocal cord polyps    Vocal cord polyps     Past Surgical History:  Procedure Laterality Date   COLONOSCOPY WITH PROPOFOL N/A 10/22/2018   Procedure: COLONOSCOPY WITH PROPOFOL;  Surgeon: [name], MD;  Location: [facility] ENDOSCOPY;  Service: Gastroenterology;  Laterality: N/A;   ESOPHAGOGASTRODUODENOSCOPY (EGD) WITH PROPOFOL N/A 08/07/2017   Procedure: ESOPHAGOGASTRODUODENOSCOPY (EGD) WITH PROPOFOL;  Surgeon: [name], MD;  Location: [facility] ENDOSCOPY;  Service: Gastroenterology;  Laterality: N/A;   removal vocal cord polyps     06/07/19 [facility] ent Dr. Beverely Risen   ROOT CANAL      FAMHx:  Family History  Problem Relation Age of Onset    Diabetes Mother    Hypertension Mother    Stroke Mother    Alcohol abuse Father    Asthma Brother    Diabetes Paternal Grandmother    Heart disease Paternal Grandmother    Cancer Paternal Grandmother        ?type cancer    Stroke Paternal Grandfather    Cancer Maternal Aunt        >type    Stroke Maternal Uncle    Diabetes Maternal Uncle     SOCHx:   reports that he quit smoking about 4 years ago. His smoking use included cigarettes. He has never used smokeless tobacco. He reports current alcohol use. He reports that he does not currently use drugs.  ALLERGIES:  Allergies  Allergen Reactions   Lipitor [Atorvastatin Calcium]     Muscle weakness and pain and elevated CK    ROS: Pertinent items noted in HPI and remainder of comprehensive ROS otherwise negative.  HOME MEDS: Current Outpatient Medications on File Prior to Visit  Medication Sig Dispense Refill   Multiple Vitamin (MULTIVITAMIN ADULT PO) Take by mouth.     simvastatin (ZOCOR) 20 MG tablet Take 1 tablet (20 mg total) by mouth at bedtime. 90 tablet 1   traZODone (DESYREL) 50 MG tablet Take 0.5-1 tablets (25-50 mg total) by mouth at bedtime as needed for  sleep. 30 tablet 5   No current facility-administered medications on file prior to visit.    LABS/IMAGING: No results found for this or any previous visit (from the past 48 hour(s)). No results found.  LIPID PANEL:    Component Value Date/Time   CHOL 236 (H) 09/10/2020 0734   TRIG 263.0 (H) 09/10/2020 0734   HDL 30.30 (L) 09/10/2020 0734   CHOLHDL 8 09/10/2020 0734   VLDL 52.6 (H) 09/10/2020 0734   LDLCALC 151 (H) 04/25/2019 0814   LDLDIRECT 166.0 09/10/2020 0734    WEIGHTS: Wt Readings from Last 3 Encounters:  02/09/21 232 lb 3.2 oz (105.3 kg)  09/01/20 230 lb 9.6 oz (104.6 kg)  07/01/20 220 lb (99.8 kg)    VITALS: BP 115/78    Pulse 90    Ht 5\' 9"  (1.753 m)    Wt 232 lb 3.2 oz (105.3 kg)    SpO2 98%    BMI 34.29 kg/m   EXAM: General  appearance: alert and no distress Neck: no carotid bruit, no JVD, and thyroid not enlarged, symmetric, no tenderness/mass/nodules Lungs: clear to auscultation bilaterally Heart: regular rate and rhythm, S1, S2 normal, no murmur, click, rub or gallop Abdomen: soft, non-tender; bowel sounds normal; no masses,  no organomegaly Extremities: extremities normal, atraumatic, no cyanosis or edema Pulses: 2+ and symmetric Skin: Skin color, texture, turgor normal. 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  Subsequently has been switched to simvastatin and seems to be tolerating that.  I would like to repeat lipids as well as a CK to see where his levels are.  His baseline CK is elevated somewhat, this is not unusual given his African-American descent where it is more common to have higher CK values.  The lab elevation to over 2000, however is likely related to the statin.  I would like to get a calcium score to see if there is any early onset heart disease.  He may need additional therapies.  Thanks again for the kind referral.  Pixie Casino, MD, FACC, [place] Director of the [facility]  [facility] Diplomate of the American Board of Clinical Lipidology Attending Cardiologist  Direct Dial: [phone]   Fax: [phone]  Website:  [email or web address] 02/09/2021, 4:45 PM

## 2021-02-09 NOTE — Patient Instructions (Signed)
Medication Instructions:  NO CHANGES -- will depend on lab results + calcium score  *If you need a refill on your cardiac medications before your next appointment, please call your pharmacy*   Lab Work: FASTING lab work as soon as able -- NMR lipoprofile, LP(a), CK  If you have labs (blood work) drawn today and your tests are completely normal, you will receive your results only by: MyChart Message (if you have MyChart) OR A paper copy in the mail If you have any lab test that is abnormal or we need to change your treatment, we will call you to review the results.   Testing/Procedures: Dr. Debara Pickett has ordered a CT coronary calcium score.   Test locations:  [facility] ([address] [place], [place] [address]) [facility] ([address] [place], [place] [address])   This is $99 out of pocket.   Coronary CalciumScan A coronary calcium scan is an imaging test used to look for deposits of calcium and other fatty materials (plaques) in the inner lining of the blood vessels of the heart (coronary arteries). These deposits of calcium and plaques can partly clog and narrow the coronary arteries without producing any symptoms or warning signs. This puts a person at risk for a heart attack. This test can detect these deposits before symptoms develop. Tell a health care provider about: Any allergies you have. All medicines you are taking, including vitamins, herbs, eye drops, creams, and over-the-counter medicines. Any problems you or family members have had with anesthetic medicines. Any blood disorders you have. Any surgeries you have had. Any medical conditions you have. Whether you are pregnant or may be pregnant. What are the risks? Generally, this is a safe procedure. However, problems may occur, including: Harm to a pregnant woman and her unborn baby. This test involves the use of radiation. Radiation exposure can be dangerous to a pregnant woman and her unborn  baby. If you are pregnant, you generally should not have this procedure done. Slight increase in the risk of cancer. This is because of the radiation involved in the test. What happens before the procedure? No preparation is needed for this procedure. What happens during the procedure? You will undress and remove any jewelry around your neck or chest. You will put on a hospital gown. Sticky electrodes will be placed on your chest. The electrodes will be connected to an electrocardiogram (ECG) machine to record a tracing of the electrical activity of your heart. A CT scanner will take pictures of your heart. During this time, you will be asked to lie still and hold your breath for 2-3 seconds while a picture of your heart is being taken. The procedure may vary among health care providers and hospitals. What happens after the procedure? You can get dressed. You can return to your normal activities. It is up to you to get the results of your test. Ask your health care provider, or the department that is doing the test, when your results will be ready. Summary A coronary calcium scan is an imaging test used to look for deposits of calcium and other fatty materials (plaques) in the inner lining of the blood vessels of the heart (coronary arteries). Generally, this is a safe procedure. Tell your health care provider if you are pregnant or may be pregnant. No preparation is needed for this procedure. A CT scanner will take pictures of your heart. You can return to your normal activities after the scan is done. This information is  not intended to replace advice given to you by your health care provider. Make sure you discuss any questions you have with your health care provider. Document Released: 07/09/2007 Document Revised: 11/30/2015 Document Reviewed: 11/30/2015 Elsevier Interactive Patient Education  2017 [place]: At [facility], you and your health needs are our  priority.  As part of our continuing mission to provide you with exceptional heart care, we have created designated Provider Care Teams.  These Care Teams include your primary Cardiologist (physician) and Advanced Practice Providers (APPs -  Physician Assistants and Nurse Practitioners) who all work together to provide you with the care you need, when you need it.  We recommend signing up for the patient portal called "MyChart".  Sign up information is provided on this After Visit Summary.  MyChart is used to connect with patients for Virtual Visits (Telemedicine).  Patients are able to view lab/test results, encounter notes, upcoming appointments, etc.  Non-urgent messages can be sent to your provider as well.   To learn more about what you can do with MyChart, go to [email or web address].    Your next appointment:   4 month(s) - lipid clinic  The format for your next appointment:   In Person  Provider:   Dr. Debara Pickett

## 2021-03-03 ENCOUNTER — Other Ambulatory Visit (INDEPENDENT_AMBULATORY_CARE_PROVIDER_SITE_OTHER): Payer: 59

## 2021-03-03 ENCOUNTER — Other Ambulatory Visit: Payer: Self-pay

## 2021-03-03 DIAGNOSIS — R7303 Prediabetes: Secondary | ICD-10-CM

## 2021-03-03 DIAGNOSIS — E785 Hyperlipidemia, unspecified: Secondary | ICD-10-CM

## 2021-03-03 LAB — HEMOGLOBIN A1C: Hgb A1c MFr Bld: 6.3 % (ref 4.6–6.5)

## 2021-03-04 ENCOUNTER — Ambulatory Visit (INDEPENDENT_AMBULATORY_CARE_PROVIDER_SITE_OTHER): Payer: 59

## 2021-03-04 ENCOUNTER — Encounter: Payer: Self-pay | Admitting: Internal Medicine

## 2021-03-04 ENCOUNTER — Other Ambulatory Visit: Payer: Self-pay

## 2021-03-04 ENCOUNTER — Ambulatory Visit: Payer: 59 | Admitting: Internal Medicine

## 2021-03-04 VITALS — BP 136/78 | HR 92 | Temp 98.5°F | Ht 69.0 in | Wt 235.0 lb

## 2021-03-04 DIAGNOSIS — R051 Acute cough: Secondary | ICD-10-CM

## 2021-03-04 DIAGNOSIS — R748 Abnormal levels of other serum enzymes: Secondary | ICD-10-CM

## 2021-03-04 DIAGNOSIS — M47816 Spondylosis without myelopathy or radiculopathy, lumbar region: Secondary | ICD-10-CM

## 2021-03-04 DIAGNOSIS — J40 Bronchitis, not specified as acute or chronic: Secondary | ICD-10-CM

## 2021-03-04 DIAGNOSIS — M545 Low back pain, unspecified: Secondary | ICD-10-CM | POA: Diagnosis not present

## 2021-03-04 DIAGNOSIS — F5104 Psychophysiologic insomnia: Secondary | ICD-10-CM

## 2021-03-04 DIAGNOSIS — R7303 Prediabetes: Secondary | ICD-10-CM | POA: Diagnosis not present

## 2021-03-04 DIAGNOSIS — E785 Hyperlipidemia, unspecified: Secondary | ICD-10-CM | POA: Diagnosis not present

## 2021-03-04 DIAGNOSIS — Z23 Encounter for immunization: Secondary | ICD-10-CM

## 2021-03-04 LAB — COMPREHENSIVE METABOLIC PANEL
ALT: 33 U/L (ref 0–53)
AST: 18 U/L (ref 0–37)
Albumin: 4.7 g/dL (ref 3.5–5.2)
Alkaline Phosphatase: 63 U/L (ref 39–117)
BUN: 12 mg/dL (ref 6–23)
CO2: 26 mEq/L (ref 19–32)
Calcium: 9.5 mg/dL (ref 8.4–10.5)
Chloride: 105 mEq/L (ref 96–112)
Creatinine, Ser: 1.06 mg/dL (ref 0.40–1.50)
GFR: 83.52 mL/min (ref >60.00)
Glucose, Bld: 102 mg/dL — ABNORMAL HIGH (ref 70–99)
Potassium: 3.8 mEq/L (ref 3.5–5.1)
Sodium: 139 mEq/L (ref 135–145)
Total Bilirubin: 0.5 mg/dL (ref 0.2–1.2)
Total Protein: 6.5 g/dL (ref 6.0–8.3)

## 2021-03-04 LAB — LIPID PANEL
Cholesterol: 189 mg/dL (ref 0–200)
HDL: 36.7 mg/dL — ABNORMAL LOW (ref >39.00)
LDL Cholesterol: 122 mg/dL — ABNORMAL HIGH (ref 0–99)
NonHDL: 152.76
Total CHOL/HDL Ratio: 5
Triglycerides: 154 mg/dL — ABNORMAL HIGH (ref 0.0–149.0)
VLDL: 30.8 mg/dL (ref 0.0–40.0)

## 2021-03-04 MED ORDER — TRAZODONE HCL 50 MG PO TABS: 25.0000 mg | ORAL_TABLET | Freq: Every evening | ORAL | 5 refills | Status: DC | PRN

## 2021-03-04 MED ORDER — SIMVASTATIN 20 MG PO TABS: 20.0000 mg | ORAL_TABLET | Freq: Every day | ORAL | 3 refills | Status: DC

## 2021-03-04 MED ORDER — ALBUTEROL SULFATE HFA 108 (90 BASE) MCG/ACT IN AERS: 2.0000 | INHALATION_SPRAY | Freq: Four times a day (QID) | RESPIRATORY_TRACT | 0 refills | Status: DC | PRN

## 2021-03-04 MED ORDER — AZITHROMYCIN 250 MG PO TABS: ORAL_TABLET | ORAL | 0 refills | Status: AC

## 2021-03-04 NOTE — Patient Instructions (Addendum)
Niacin can raise ldl  But can rash or flushing   Tylenol  Aspercream with lidocaine or voltaren gel  Lidocaine or salonpas pain patch   Consider Physical therapy  Ice or heat    Low Back Sprain or Strain Rehab Ask your health care provider which exercises are safe for you. Do exercises exactly as told by your health care provider and adjust them as directed. It is normal to feel mild stretching, pulling, tightness, or discomfort as you do these exercises. Stop right away if you feel sudden pain or your pain gets worse. Do not begin these exercises until told by your health care provider. Stretching and range-of-motion exercises These exercises warm up your muscles and joints and improve the movement and flexibility of your back. These exercises also help to relieve pain, numbness, and tingling. Lumbar rotation  Lie on your back on a firm bed or the floor with your knees bent. Straighten your arms out to your sides so each arm forms a 90-degree angle (right angle) with a side of your body. Slowly move (rotate) both of your knees to one side of your body until you feel a stretch in your lower back (lumbar). Try not to let your shoulders lift off the floor. Hold this position for __________ seconds. Tense your abdominal muscles and slowly move your knees back to the starting position. Repeat this exercise on the other side of your body. Repeat __________ times. Complete this exercise __________ times a day. Single knee to chest  Lie on your back on a firm bed or the floor with both legs straight. Bend one of your knees. Use your hands to move your knee up toward your chest until you feel a gentle stretch in your lower back and buttock. Hold your leg in this position by holding on to the front of your knee. Keep your other leg as straight as possible. Hold this position for __________ seconds. Slowly return to the starting position. Repeat with your other leg. Repeat __________ times.  Complete this exercise __________ times a day. Prone extension on elbows  Lie on your abdomen on a firm bed or the floor (prone position). Prop yourself up on your elbows. Use your arms to help lift your chest up until you feel a gentle stretch in your abdomen and your lower back. This will place some of your body weight on your elbows. If this is uncomfortable, try stacking pillows under your chest. Your hips should stay down, against the surface that you are lying on. Keep your hip and back muscles relaxed. Hold this position for __________ seconds. Slowly relax your upper body and return to the starting position. Repeat __________ times. Complete this exercise __________ times a day. Strengthening exercises These exercises build strength and endurance in your back. Endurance is the ability to use your muscles for a long time, even after they get tired. Pelvic tilt This exercise strengthens the muscles that lie deep in the abdomen. Lie on your back on a firm bed or the floor with your legs extended. Bend your knees so they are pointing toward the ceiling and your feet are flat on the floor. Tighten your lower abdominal muscles to press your lower back against the floor. This motion will tilt your pelvis so your tailbone points up toward the ceiling instead of pointing to your feet or the floor. To help with this exercise, you may place a small towel under your lower back and try to push your back into  the towel. Hold this position for __________ seconds. Let your muscles relax completely before you repeat this exercise. Repeat __________ times. Complete this exercise __________ times a day. Alternating arm and leg raises  Get on your hands and knees on a firm surface. If you are on a hard floor, you may want to use padding, such as an exercise mat, to cushion your knees. Line up your arms and legs. Your hands should be directly below your shoulders, and your knees should be directly below  your hips. Lift your left leg behind you. At the same time, raise your right arm and straighten it in front of you. Do not lift your leg higher than your hip. Do not lift your arm higher than your shoulder. Keep your abdominal and back muscles tight. Keep your hips facing the ground. Do not arch your back. Keep your balance carefully, and do not hold your breath. Hold this position for __________ seconds. Slowly return to the starting position. Repeat with your right leg and your left arm. Repeat __________ times. Complete this exercise __________ times a day. Abdominal set with straight leg raise  Lie on your back on a firm bed or the floor. Bend one of your knees and keep your other leg straight. Tense your abdominal muscles and lift your straight leg up, 4-6 inches (10-15 cm) off the ground. Keep your abdominal muscles tight and hold this position for __________ seconds. Do not hold your breath. Do not arch your back. Keep it flat against the ground. Keep your abdominal muscles tense as you slowly lower your leg back to the starting position. Repeat with your other leg. Repeat __________ times. Complete this exercise __________ times a day. Single leg lower with bent knees Lie on your back on a firm bed or the floor. Tense your abdominal muscles and lift your feet off the floor, one foot at a time, so your knees and hips are bent in 90-degree angles (right angles). Your knees should be over your hips and your lower legs should be parallel to the floor. Keeping your abdominal muscles tense and your knee bent, slowly lower one of your legs so your toe touches the ground. Lift your leg back up to return to the starting position. Do not hold your breath. Do not let your back arch. Keep your back flat against the ground. Repeat with your other leg. Repeat __________ times. Complete this exercise __________ times a day. Posture and body mechanics Good posture and healthy body mechanics  can help to relieve stress in your body's tissues and joints. Body mechanics refers to the movements and positions of your body while you do your daily activities. Posture is part of body mechanics. Good posture means: Your spine is in its natural S-curve position (neutral). Your shoulders are pulled back slightly. Your head is not tipped forward (neutral). Follow these guidelines to improve your posture and body mechanics in your everyday activities. Standing  When standing, keep your spine neutral and your feet about hip-width apart. Keep a slight bend in your knees. Your ears, shoulders, and hips should line up. When you do a task in which you stand in one place for a long time, place one foot up on a stable object that is 2-4 inches (5-10 cm) high, such as a footstool. This helps keep your spine neutral. Sitting  When sitting, keep your spine neutral and keep your feet flat on the floor. Use a footrest, if necessary, and keep your thighs parallel  to the floor. Avoid rounding your shoulders, and avoid tilting your head forward. When working at a desk or a computer, keep your desk at a height where your hands are slightly lower than your elbows. Slide your chair under your desk so you are close enough to maintain good posture. When working at a computer, place your monitor at a height where you are looking straight ahead and you do not have to tilt your head forward or downward to look at the screen. Resting When lying down and resting, avoid positions that are most painful for you. If you have pain with activities such as sitting, bending, stooping, or squatting, lie in a position in which your body does not bend very much. For example, avoid curling up on your side with your arms and knees near your chest (fetal position). If you have pain with activities such as standing for a long time or reaching with your arms, lie with your spine in a neutral position and bend your knees slightly. Try the  following positions: Lying on your side with a pillow between your knees. Lying on your back with a pillow under your knees. Lifting  When lifting objects, keep your feet at least shoulder-width apart and tighten your abdominal muscles. Bend your knees and hips and keep your spine neutral. It is important to lift using the strength of your legs, not your back. Do not lock your knees straight out. Always ask for help to lift heavy or awkward objects. This information is not intended to replace advice given to you by your health care provider. Make sure you discuss any questions you have with your health care provider. Document Revised: 03/30/2020 Document Reviewed: 03/30/2020 Else[employer] Patient Education  [place].  Back Exercises The following exercises strengthen the muscles that help to support the trunk (torso) and back. They also help to keep the lower back flexible. Doing these exercises can help to prevent or lessen existing low back pain. If you have back pain or discomfort, try doing these exercises 2-3 times each day or as told by your health care provider. As your pain improves, do them once each day, but increase the number of times that you repeat the steps for each exercise (do more repetitions). To prevent the recurrence of back pain, continue to do these exercises once each day or as told by your health care provider. Do exercises exactly as told by your health care provider and adjust them as directed. It is normal to feel mild stretching, pulling, tightness, or discomfort as you do these exercises, but you should stop right away if you feel sudden pain or your pain gets worse. Exercises Single knee to chest Repeat these steps 3-5 times for each leg: Lie on your back on a firm bed or the floor with your legs extended. Bring one knee to your chest. Your other leg should stay extended and in contact with the floor. Hold your knee in place by grabbing your knee or thigh  with both hands and hold. Pull on your knee until you feel a gentle stretch in your lower back or buttocks. Hold the stretch for 10-30 seconds. Slowly release and straighten your leg.  Pelvic tilt Repeat these steps 5-10 times: Lie on your back on a firm bed or the floor with your legs extended. Bend your knees so they are pointing toward the ceiling and your feet are flat on the floor. Tighten your lower abdominal muscles to press your lower back  against the floor. This motion will tilt your pelvis so your tailbone points up toward the ceiling instead of pointing to your feet or the floor. With gentle tension and even breathing, hold this position for 5-10 seconds.  Cat-cow Repeat these steps until your lower back becomes more flexible: Get into a hands-and-knees position on a firm bed or the floor. Keep your hands under your shoulders, and keep your knees under your hips. You may place padding under your knees for comfort. Let your head hang down toward your chest. Contract your abdominal muscles and point your tailbone toward the floor so your lower back becomes rounded like the back of a cat. Hold this position for 5 seconds. Slowly lift your head, let your abdominal muscles relax, and point your tailbone up toward the ceiling so your back forms a sagging arch like the back of a cow. Hold this position for 5 seconds.  Press-ups Repeat these steps 5-10 times: Lie on your abdomen (face-down) on a firm bed or the floor. Place your palms near your head, about shoulder-width apart. Keeping your back as relaxed as possible and keeping your hips on the floor, slowly straighten your arms to raise the top half of your body and lift your shoulders. Do not use your back muscles to raise your upper torso. You may adjust the placement of your hands to make yourself more comfortable. Hold this position for 5 seconds while you keep your back relaxed. Slowly return to lying flat on the  floor.  Bridges Repeat these steps 10 times: Lie on your back on a firm bed or the floor. Bend your knees so they are pointing toward the ceiling and your feet are flat on the floor. Your arms should be flat at your sides, next to your body. Tighten your buttocks muscles and lift your buttocks off the floor until your waist is at almost the same height as your knees. You should feel the muscles working in your buttocks and the back of your thighs. If you do not feel these muscles, slide your feet 1-2 inches (2.5-5 cm) farther away from your buttocks. Hold this position for 3-5 seconds. Slowly lower your hips to the starting position, and allow your buttocks muscles to relax completely. If this exercise is too easy, try doing it with your arms crossed over your chest. Abdominal crunches Repeat these steps 5-10 times: Lie on your back on a firm bed or the floor with your legs extended. Bend your knees so they are pointing toward the ceiling and your feet are flat on the floor. Cross your arms over your chest. Tip your chin slightly toward your chest without bending your neck. Tighten your abdominal muscles and slowly raise your torso high enough to lift your shoulder blades a tiny bit off the floor. Avoid raising your torso higher than that because it can put too much stress on your lower back and does not help to strengthen your abdominal muscles. Slowly return to your starting position.  Back lifts Repeat these steps 5-10 times: Lie on your abdomen (face-down) with your arms at your sides, and rest your forehead on the floor. Tighten the muscles in your legs and your buttocks. Slowly lift your chest off the floor while you keep your hips pressed to the floor. Keep the back of your head in line with the curve in your back. Your eyes should be looking at the floor. Hold this position for 3-5 seconds. Slowly return to your starting position.  Contact a health care provider if: Your back pain  or discomfort gets much worse when you do an exercise. Your worsening back pain or discomfort does not lessen within 2 hours after you exercise. If you have any of these problems, stop doing these exercises right away. Do not do them again unless your health care provider says that you can. Get help right away if: You develop sudden, severe back pain. If this happens, stop doing the exercises right away. Do not do them again unless your health care provider says that you can. This information is not intended to replace advice given to you by your health care provider. Make sure you discuss any questions you have with your health care provider. Document Revised: 07/07/2020 Document Reviewed: 03/25/2020 Elsevier Patient Education  2022 [place].  Prediabetes Prediabetes is when your blood sugar (blood glucose) level is higher than normal but not high enough for you to be diagnosed with type 2 diabetes. Having prediabetes puts you at risk for developing type 2 diabetes (type 2 diabetes mellitus). With certain lifestyle changes, you may be able to prevent or delay the onset of type 2 diabetes. This is important because type 2 diabetes can lead to serious complications, such as: Heart disease. Stroke. Blindness. Kidney disease. Depression. Poor circulation in the feet and legs. In severe cases, this could lead to surgical removal of a leg (amputation). What are the causes? The exact cause of prediabetes is not known. It may result from insulin resistance. Insulin resistance develops when cells in the body do not respond properly to insulin that the body makes. This can cause excess glucose to build up in the blood. High blood glucose (hyperglycemia) can develop. What increases the risk? The following factors may make you more likely to develop this condition: You have a family member with type 2 diabetes. You are older than 45 years. You had a temporary form of diabetes during a pregnancy  (gestational diabetes). You had polycystic ovary syndrome (PCOS). You are overweight or obese. You are inactive (sedentary). You have a history of heart disease, including problems with cholesterol levels, high levels of blood fats, or high blood pressure. What are the signs or symptoms? You may have no symptoms. If you do have symptoms, they may include: Increased hunger. Increased thirst. Increased urination. Vision changes, such as blurry vision. Tiredness (fatigue). How is this diagnosed? This condition can be diagnosed with blood tests. Your blood glucose may be checked with one or more of the following tests: A fasting blood glucose (FBG) test. You will not be allowed to eat (you will fast) for at least 8 hours before a blood sample is taken. An A1C blood test (hemoglobin A1C). This test provides information about blood glucose levels over the previous 2?3 months. An oral glucose tolerance test (OGTT). This test measures your blood glucose at two points in time: After fasting. This is your baseline level. Two hours after you drink a beverage that contains glucose. You may be diagnosed with prediabetes if: Your FBG is 100?125 mg/dL (5.6-6.9 mmol/L). Your A1C level is 5.7?6.4% (39-46 mmol/mol). Your OGTT result is 140?199 mg/dL (7.8-11 mmol/L). These blood tests may be repeated to confirm your diagnosis. How is this treated? Treatment may include dietary and lifestyle changes to help lower your blood glucose and prevent type 2 diabetes from developing. In some cases, medicine may be prescribed to help lower the risk of type 2 diabetes. Follow these instructions at home: Nutrition  Follow a  healthy meal plan. This includes eating lean proteins, whole grains, legumes, fresh fruits and vegetables, low-fat dairy products, and healthy fats. Follow instructions from your health care provider about eating or drinking restrictions. Meet with a dietitian to create a healthy eating plan that  is right for you. Lifestyle Do moderate-intensity exercise for at least 30 minutes a day on 5 or more days each week, or as told by your health care provider. A mix of activities may be best, such as: Brisk walking, swimming, biking, and weight lifting. Lose weight as told by your health care provider. Losing 5-7% of your body weight can reverse insulin resistance. Do not drink alcohol if: Your health care provider tells you not to drink. You are pregnant, may be pregnant, or are planning to become pregnant. If you drink alcohol: Limit how much you use to: 0-1 drink a day for women. 0-2 drinks a day for men. Be aware of how much alcohol is in your drink. In the [place] one drink equals one 12 oz bottle of beer (355 mL), one 5 oz glass of wine (148 mL), or one 1 oz glass of hard liquor (44 mL). General instructions Take over-the-counter and prescription medicines only as told by your health care provider. You may be prescribed medicines that help lower the risk of type 2 diabetes. Do not use any products that contain nicotine or tobacco, such as cigarettes, e-cigarettes, and chewing tobacco. If you need help quitting, ask your health care provider. Keep all follow-up visits. This is important. Where to find more information American Diabetes Association: www.diabetes.org Academy of Nutrition and Dietetics: www.eatright.org American Heart Association: www.heart.org Contact a health care provider if: You have any of these symptoms: Increased hunger. Increased urination. Increased thirst. Fatigue. Vision changes, such as blurry vision. Get help right away if you: Have shortness of breath. Feel confused. Vomit or feel like you may vomit. Summary Prediabetes is when your blood sugar (blood glucose)level is higher than normal but not high enough for you to be diagnosed with type 2 diabetes. Having prediabetes puts you at risk for developing type 2 diabetes (type 2 diabetes mellitus). Make  lifestyle changes such as eating a healthy diet and exercising regularly to help prevent diabetes. Lose weight as told by your health care provider. This information is not intended to replace advice given to you by your health care provider. Make sure you discuss any questions you have with your health care provider. Document Revised: 04/11/2019 Document Reviewed: 04/11/2019 Elsevier Patient Education  2022 [place].  Prediabetes Eating Plan Prediabetes is a condition that causes blood sugar (glucose) levels to be higher than normal. This increases the risk for developing type 2 diabetes (type 2 diabetes mellitus). Working with a health care provider or nutrition specialist (dietitian) to make diet and lifestyle changes can help prevent the onset of diabetes. These changes may help you: Control your blood glucose levels. Improve your cholesterol levels. Manage your blood pressure. What are tips for following this plan? Reading food labels Read food labels to check the amount of fat, salt (sodium), and sugar in prepackaged foods. Avoid foods that have: Saturated fats. Trans fats. Added sugars. Avoid foods that have more than 300 milligrams (mg) of sodium per serving. Limit your sodium intake to less than 2,300 mg each day. Shopping Avoid buying pre-made and processed foods. Avoid buying drinks with added sugar. Cooking Cook with olive oil. Do not use butter, lard, or ghee. Bake, broil, grill, steam, or boil foods.  Avoid frying. Meal planning  Work with your dietitian to create an eating plan that is right for you. This may include tracking how many calories you take in each day. Use a food diary, notebook, or mobile application to track what you eat at each meal. Consider following a Mediterranean diet. This includes: Eating several servings of fresh fruits and vegetables each day. Eating fish at least twice a week. Eating one serving each day of whole grains, beans, nuts, and  seeds. Using olive oil instead of other fats. Limiting alcohol. Limiting red meat. Using nonfat or low-fat dairy products. Consider following a plant-based diet. This includes dietary choices that focus on eating mostly vegetables and fruit, grains, beans, nuts, and seeds. If you have high blood pressure, you may need to limit your sodium intake or follow a diet such as the DASH (Dietary Approaches to Stop Hypertension) eating plan. The DASH diet aims to lower high blood pressure. Lifestyle Set weight loss goals with help from your health care team. It is recommended that most people with prediabetes lose 7% of their body weight. Exercise for at least 30 minutes 5 or more days a week. Attend a support group or seek support from a mental health counselor. Take over-the-counter and prescription medicines only as told by your health care provider. What foods are recommended? Fruits Berries. Bananas. Apples. Oranges. Grapes. Papaya. Mango. Pomegranate. Kiwi. Grapefruit. Cherries. Vegetables Lettuce. Spinach. Peas. Beets. Cauliflower. Cabbage. Broccoli. Carrots. Tomatoes. Squash. Eggplant. Herbs. Peppers. Onions. Cucumbers. Brussels sprouts. Grains Whole grains, such as whole-wheat or whole-grain breads, crackers, cereals, and pasta. Unsweetened oatmeal. Bulgur. Barley. Quinoa. Brown rice. Corn or whole-wheat flour tortillas or taco shells. Meats and other proteins Seafood. Poultry without skin. Lean cuts of pork and beef. Tofu. Eggs. Nuts. Beans. Dairy Low-fat or fat-free dairy products, such as yogurt, cottage cheese, and cheese. Beverages Water. Tea. Coffee. Sugar-free or diet soda. Seltzer water. Low-fat or nonfat milk. Milk alternatives, such as soy or almond milk. Fats and oils Olive oil. Canola oil. Sunflower oil. Grapeseed oil. Avocado. Walnuts. Sweets and desserts Sugar-free or low-fat pudding. Sugar-free or low-fat ice cream and other frozen treats. Seasonings and condiments Herbs.  Sodium-free spices. Mustard. Relish. Low-salt, low-sugar ketchup. Low-salt, low-sugar barbecue sauce. Low-fat or fat-free mayonnaise. The items listed above may not be a complete list of recommended foods and beverages. Contact a dietitian for more information. What foods are not recommended? Fruits Fruits canned with syrup. Vegetables Canned vegetables. Frozen vegetables with butter or cream sauce. Grains Refined white flour and flour products, such as bread, pasta, snack foods, and cereals. Meats and other proteins Fatty cuts of meat. Poultry with skin. Breaded or fried meat. Processed meats. Dairy Full-fat yogurt, cheese, or milk. Beverages Sweetened drinks, such as iced tea and soda. Fats and oils Butter. Lard. Ghee. Sweets and desserts Baked goods, such as cake, cupcakes, pastries, cookies, and cheesecake. Seasonings and condiments Spice mixes with added salt. Ketchup. Barbecue sauce. Mayonnaise. The items listed above may not be a complete list of foods and beverages that are not recommended. Contact a dietitian for more information. Where to find more information American Diabetes Association: www.diabetes.org Summary You may need to make diet and lifestyle changes to help prevent the onset of diabetes. These changes can help you control blood sugar, improve cholesterol levels, and manage blood pressure. Set weight loss goals with help from your health care team. It is recommended that most people with prediabetes lose 7% of their body weight. Consider following  a Mediterranean diet. This includes eating plenty of fresh fruits and vegetables, whole grains, beans, nuts, seeds, fish, and low-fat dairy, and using olive oil instead of other fats. This information is not intended to replace advice given to you by your health care provider. Make sure you discuss any questions you have with your health care provider. Document Revised: 04/11/2019 Document Reviewed: 04/11/2019 Elsevier  Patient Education  [place].

## 2021-03-04 NOTE — Progress Notes (Signed)
Chief Complaint  Patient presents with   Follow-up   F/u  1. Hld on zocor 20 mg qhs  2. 1 week c/o cough sniffles sore throat but overall feeling well trying ah otc and mucinex former smoker quit 1 year ago  3. 02/27/08 low back midline pain est with working on cars trying out back brace will Xray today consider PT/MRI    Review of Systems  Constitutional:  Negative for weight loss.  HENT:  Positive for sore throat. Negative for hearing loss.   Eyes:  Negative for blurred vision.  Respiratory:  Positive for cough. Negative for shortness of breath.   Cardiovascular:  Negative for chest pain.  Gastrointestinal:  Negative for abdominal pain and blood in stool.  Musculoskeletal:  Positive for back pain.  Skin:  Negative for rash.  Neurological:  Negative for headaches.  Psychiatric/Behavioral:  Negative for depression.   Past Medical History:  Diagnosis Date   Anxiety    Chicken pox    COVID-19    06/2020   Hyperlipidemia    Muscle tension dysphonia    [facility] Dr. Beverely Risen   Pneumonia    newborn   Sinusitis    Vocal cord polyps    Vocal cord polyps    Past Surgical History:  Procedure Laterality Date   COLONOSCOPY WITH PROPOFOL N/A 10/22/2018   Procedure: COLONOSCOPY WITH PROPOFOL;  Surgeon: [name], MD;  Location: [facility] ENDOSCOPY;  Service: Gastroenterology;  Laterality: N/A;   ESOPHAGOGASTRODUODENOSCOPY (EGD) WITH PROPOFOL N/A 08/07/2017   Procedure: ESOPHAGOGASTRODUODENOSCOPY (EGD) WITH PROPOFOL;  Surgeon: [name], MD;  Location: [facility] ENDOSCOPY;  Service: Gastroenterology;  Laterality: N/A;   removal vocal cord polyps     06/07/19 [facility] ent Dr. Beverely Risen   ROOT CANAL     Family History  Problem Relation Age of Onset   Diabetes Mother    Hypertension Mother    Stroke Mother    Alcohol abuse Father    Asthma Brother    Diabetes Paternal Grandmother    Heart disease Paternal Grandmother    Cancer Paternal Grandmother        ?type cancer    Stroke Paternal  Grandfather    Cancer Maternal Aunt        >type    Stroke Maternal Uncle    Diabetes Maternal Uncle    Social History   Socioeconomic History   Marital status: Married    Spouse name: Not on file   Number of children: Not on file   Years of education: Not on file   Highest education level: Not on file  Occupational History   Not on file  Tobacco Use   Smoking status: Former    Types: Cigarettes    Quit date: 2019    Years since quitting: 4.1   Smokeless tobacco: Never   Tobacco comments:    smoker since age 33 on and off now 1-2 cig/day max 1/2 ppd   Vaping Use   Vaping Use: Never used  Substance and Sexual Activity   Alcohol use: Yes    Comment: social   Drug use: Not Currently    Comment: thc sometimes    Sexual activity: Yes  Other Topics Concern   Not on file  Social History Narrative   Married Arts development officer    Former Nature conservation officer    Some college Programmer, applications    2 brothers    3 step kids    Social Determinants of Radio broadcast assistant  Strain: Not on file  Food Insecurity: Not on file  Transportation Needs: Not on file  Physical Activity: Not on file  Stress: Not on file  Social Connections: Not on file  Intimate Partner Violence: Not on file   Current Meds  Medication Sig   albuterol (VENTOLIN HFA) 108 (90 Base) MCG/ACT inhaler Inhale 2 puffs into the lungs every 6 (six) hours as needed for wheezing or shortness of breath.   azithromycin (ZITHROMAX) 250 MG tablet With food Take 2 tablets on day 1, then 1 tablet daily on days 2 through 5   Multiple Vitamin (MULTIVITAMIN ADULT PO) Take by mouth.   [DISCONTINUED] simvastatin (ZOCOR) 20 MG tablet Take 1 tablet (20 mg total) by mouth at bedtime.   [DISCONTINUED] traZODone (DESYREL) 50 MG tablet Take 0.5-1 tablets (25-50 mg total) by mouth at bedtime as needed for sleep.   Allergies  Allergen Reactions   Lipitor [Atorvastatin Calcium]     Muscle weakness and pain and elevated CK   Recent Results  (from the past 2160 hour(s))  HgB A1c     Status: None   Collection Time: 03/03/21  7:33 AM  Result Value Ref Range   Hgb A1c MFr Bld 6.3 4.6 - 6.5 %    Comment: Glycemic Control Guidelines for People with Diabetes:Non Diabetic:  <6%Goal of Therapy: <7%Additional Action Suggested:  >8%   Comp Met (CMET)     Status: Abnormal   Collection Time: 03/03/21  7:33 AM  Result Value Ref Range   Sodium 139 135 - 145 mEq/L   Potassium 3.8 3.5 - 5.1 mEq/L   Chloride 105 96 - 112 mEq/L   CO2 26 19 - 32 mEq/L   Glucose, Bld 102 (H) 70 - 99 mg/dL   BUN 12 6 - 23 mg/dL   Creatinine, Ser 1.06 0.40 - 1.50 mg/dL   Total Bilirubin 0.5 0.2 - 1.2 mg/dL   Alkaline Phosphatase 63 39 - 117 U/L   AST 18 0 - 37 U/L   ALT 33 0 - 53 U/L   Total Protein 6.5 6.0 - 8.3 g/dL   Albumin 4.7 3.5 - 5.2 g/dL   GFR 83.52 >60.00 mL/min    Comment: Calculated using the CKD-EPI Creatinine Equation (2021)   Calcium 9.5 8.4 - 10.5 mg/dL  Lipid panel     Status: Abnormal   Collection Time: 03/03/21  7:33 AM  Result Value Ref Range   Cholesterol 189 0 - 200 mg/dL    Comment: ATP III Classification       Desirable:  < 200 mg/dL               Borderline High:  200 - 239 mg/dL          High:  > = 240 mg/dL   Triglycerides 154.0 (H) 0.0 - 149.0 mg/dL    Comment: Normal:  <150 mg/dLBorderline High:  150 - 199 mg/dL   HDL 36.70 (L) >39.00 mg/dL   VLDL 30.8 0.0 - 40.0 mg/dL   LDL Cholesterol 122 (H) 0 - 99 mg/dL   Total CHOL/HDL Ratio 5     Comment:                Men          Women1/2 Average Risk     3.4          3.3Average Risk          5.0  4.42X Average Risk          9.6          7.13X Average Risk          15.0          11.0                       NonHDL 152.76     Comment: NOTE:  Non-HDL goal should be 30 mg/dL higher than patient's LDL goal (i.e. LDL goal of < 70 mg/dL, would have non-HDL goal of < 100 mg/dL)   Objective  Body mass index is 34.7 kg/m. Wt Readings from Last 3 Encounters:  03/04/21 235 lb (106.6  kg)  02/09/21 232 lb 3.2 oz (105.3 kg)  09/01/20 230 lb 9.6 oz (104.6 kg)   Temp Readings from Last 3 Encounters:  03/04/21 98.5 F (36.9 C) (Oral)  09/01/20 (!) 97.4 F (36.3 C) (Temporal)  07/01/20 98.4 F (36.9 C) (Oral)   BP Readings from Last 3 Encounters:  03/04/21 136/78  02/09/21 115/78  09/01/20 122/80   Pulse Readings from Last 3 Encounters:  03/04/21 92  02/09/21 90  09/01/20 87    Physical Exam Vitals and nursing note reviewed.  Constitutional:      Appearance: Normal appearance. He is well-developed and well-groomed.  HENT:     Head: Normocephalic and atraumatic.  Eyes:     Conjunctiva/sclera: Conjunctivae normal.     Pupils: Pupils are equal, round, and reactive to light.  Cardiovascular:     Rate and Rhythm: Normal rate and regular rhythm.     Heart sounds: Normal heart sounds.  Pulmonary:     Effort: Pulmonary effort is normal. No respiratory distress.     Breath sounds: Normal breath sounds.  Abdominal:     Tenderness: There is no abdominal tenderness.  Skin:    General: Skin is warm and moist.  Neurological:     General: No focal deficit present.     Mental Status: He is alert and oriented to person, place, and time. Mental status is at baseline.     Sensory: Sensation is intact.     Motor: Motor function is intact.     Coordination: Coordination is intact.     Gait: Gait is intact. Gait normal.  Psychiatric:        Attention and Perception: Attention and perception normal.        Mood and Affect: Mood and affect normal.        Speech: Speech normal.        Behavior: Behavior normal. Behavior is cooperative.        Thought Content: Thought content normal.        Cognition and Memory: Cognition and memory normal.        Judgment: Judgment normal.    Assessment  Plan  Midline low back pain without sciatica, unspecified chronicity - Plan: DG Lumbar Spine Complete Consider PT and MRI low back in the future  Back brace  Tylenol  Aspercream  with lidocaine or voltaren gel  Lidocaine or salonpas pain patch   Consider Physical therapy  Ice or heat   Prediabetes 6.3   Hyperlipidemia, unspecified hyperlipidemia type Zocor 20 mg qhs  F/u Dr. Debara Pickett  Ct cardiac 03/17/21   Acute cough - Plan: azithromycin (ZITHROMAX) 250 MG tablet Bronchitis - Plan: albuterol (VENTOLIN HFA) 108 (90 Base) MCG/ACT inhaler  Elevated CK - Plan: CK (Creatine Kinase) Add on  Chronic insomnia - Plan: traZODone (DESYREL) 50 MG tablet   HM Flu shot utd given today  Tdap given 09/01/20  MMR immune  Consider hep B vaccine   Covid had 2/2 pfizer per pt   Declines std check   Colonoscopy 10/22/18 tubular and hyperplastic polyps Dr. Vicente Males f/u in 5 years  PSA 0.60 09/10/20  Prediabetes - Plan: Hemoglobin A1c 6.3 increased   Pending neurology referral its sch for muscle twitching   Rec healthy diet and exercise    Provider: Dr. Olivia Mackie McLean-Scocuzza-Internal Medicine

## 2021-03-12 DIAGNOSIS — M47816 Spondylosis without myelopathy or radiculopathy, lumbar region: Secondary | ICD-10-CM | POA: Insufficient documentation

## 2021-03-12 MED ORDER — TIZANIDINE HCL 4 MG PO TABS: 4.0000 mg | ORAL_TABLET | Freq: Every evening | ORAL | 5 refills | Status: DC | PRN

## 2021-03-12 MED ORDER — METHYLPREDNISOLONE 4 MG PO TBPK: ORAL_TABLET | ORAL | 0 refills | Status: DC

## 2021-03-12 NOTE — Addendum Note (Signed)
Addended by: Orland Mustard on: 03/12/2021 04:37 PM   Modules accepted: Orders

## 2021-03-17 ENCOUNTER — Other Ambulatory Visit: Payer: Self-pay

## 2021-03-17 ENCOUNTER — Ambulatory Visit (INDEPENDENT_AMBULATORY_CARE_PROVIDER_SITE_OTHER)
Admission: RE | Admit: 2021-03-17 | Discharge: 2021-03-17 | Disposition: A | Discharge status: Home / Self Care | Payer: Self-pay | Source: Ambulatory Visit | Attending: Internal Medicine | Admitting: Internal Medicine

## 2021-03-17 DIAGNOSIS — E785 Hyperlipidemia, unspecified: Secondary | ICD-10-CM

## 2021-03-18 ENCOUNTER — Telehealth: Payer: Self-pay | Admitting: Internal Medicine

## 2021-03-18 NOTE — Telephone Encounter (Signed)
Pt called in stating that he did a CT scan yesterday. Pt stated that the CT provider stated through mychart that his level was 0. Pt stated he is confused about the message that was sent to him in [place]. Pt was wondering if he still have to take the cholesterol medication that was prescribe to him. Pt requesting callback.

## 2021-03-24 ENCOUNTER — Telehealth: Payer: Self-pay | Admitting: Internal Medicine

## 2021-03-24 DIAGNOSIS — G720 Drug-induced myopathy: Secondary | ICD-10-CM

## 2021-03-24 DIAGNOSIS — T466X5A Adverse effect of antihyperlipidemic and antiarteriosclerotic drugs, initial encounter: Secondary | ICD-10-CM

## 2021-03-24 NOTE — Telephone Encounter (Signed)
PA for repatha submitted via CMM ?Key: [id number] ?

## 2021-03-24 NOTE — Telephone Encounter (Signed)
Ct Done and resulted by Dr Debara Pickett Cardiology. They attempted to call Patient and sent him a mychart message.  ? ?Called and informed Patient of this. Gave Patient the number to call Cardiology back. Patient verbalized understanding ? ?

## 2021-03-26 NOTE — Telephone Encounter (Signed)
Repatha denied per [facility] ? ?The request for coverage for REPATHA SURE INJ 140MG /ML, use as directed (2 per month), is ?denied.  ?This decision is based on health plan criteria for REPATHA SURE INJ 140MG /ML. This ?medicine is covered only if: ?Atherosclerotic cardiovascular disease as confirmed by one of the following: ?(a) Acute coronary syndromes. ?(b) History of myocardial infarction. ?(c) Stable or unstable angina. ?(d) Coronary or other arterial revascularization. ?(e) Stroke. ?(f) Transient ischemic attack. ?(g) Peripheral arterial disease presumed to be of atherosclerotic origin. ?The information provided does not show that you meet the criteria listed above. ? ? ?To file an appeal, please send any written comments, documents or other relevant documentation with ?your appeal to the address listed below: ?[employer] Appeals ?Fax: [phone] ?Expedited/Urgent Fax: [phone] ? ? ?Included with PA request in [facility] was MD note and copy of calcium score test when demonstrates clinical ASCVD ? ?

## 2021-03-30 NOTE — Telephone Encounter (Signed)
Would try zetia 10 mg daily since PCSK9i denied - check LFT's and CK in 2 weeks, lipids in 3 months. ? ?Dr Lemmie Evens ?

## 2021-03-31 MED ORDER — EZETIMIBE 10 MG PO TABS
10.0000 mg | ORAL_TABLET | Freq: Every day | ORAL | 3 refills | Status: DC
Start: 2021-03-31 — End: 2023-03-29

## 2021-03-31 NOTE — Telephone Encounter (Signed)
Left message to call back  MyChart message also sent 

## 2021-03-31 NOTE — Addendum Note (Signed)
Addended by: Fidel Levy on: 03/31/2021 11:27 AM ? ? Modules accepted: Orders ? ?

## 2021-05-29 ENCOUNTER — Encounter: Payer: Self-pay | Admitting: Internal Medicine

## 2021-07-13 ENCOUNTER — Ambulatory Visit (HOSPITAL_BASED_OUTPATIENT_CLINIC_OR_DEPARTMENT_OTHER): Payer: 59 | Admitting: Internal Medicine

## 2021-07-13 ENCOUNTER — Encounter (HOSPITAL_BASED_OUTPATIENT_CLINIC_OR_DEPARTMENT_OTHER): Payer: Self-pay | Admitting: Internal Medicine

## 2021-07-13 VITALS — BP 138/88 | HR 89 | Ht 69.0 in | Wt 228.1 lb

## 2021-07-13 DIAGNOSIS — I2510 Atherosclerotic heart disease of native coronary artery without angina pectoris: Secondary | ICD-10-CM | POA: Diagnosis not present

## 2021-07-13 DIAGNOSIS — G720 Drug-induced myopathy: Secondary | ICD-10-CM

## 2021-07-13 DIAGNOSIS — I2584 Coronary atherosclerosis due to calcified coronary lesion: Secondary | ICD-10-CM

## 2021-07-13 DIAGNOSIS — T466X5D Adverse effect of antihyperlipidemic and antiarteriosclerotic drugs, subsequent encounter: Secondary | ICD-10-CM

## 2021-07-13 DIAGNOSIS — E785 Hyperlipidemia, unspecified: Secondary | ICD-10-CM

## 2021-07-13 MED ORDER — NEXLETOL 180 MG PO TABS: 1.0000 | ORAL_TABLET | Freq: Every day | ORAL | 3 refills | Status: DC

## 2021-07-13 NOTE — Patient Instructions (Signed)
Medication Instructions:  START Nexletol '180mg'$  (once approved with insurance AND after you complete CK lab test)  *If you need a refill on your cardiac medications before your next appointment, please call your pharmacy*   Lab Work: Non-Fasting CK now/ASAP  Fasting lipid panel/CK in about 4 months  If you have labs (blood work) drawn today and your tests are completely normal, you will receive your results only by: [place] (if you have MyChart) OR A paper copy in the mail If you have any lab test that is abnormal or we need to change your treatment, we will call you to review the results.   Testing/Procedures: NONE   Follow-Up: At [facility], you and your health needs are our priority.  As part of our continuing mission to provide you with exceptional heart care, we have created designated Provider Care Teams.  These Care Teams include your primary Cardiologist (physician) and Advanced Practice Providers (APPs -  Physician Assistants and Nurse Practitioners) who all work together to provide you with the care you need, when you need it.  We recommend signing up for the patient portal called "MyChart".  Sign up information is provided on this After Visit Summary.  MyChart is used to connect with patients for Virtual Visits (Telemedicine).  Patients are able to view lab/test results, encounter notes, upcoming appointments, etc.  Non-urgent messages can be sent to your provider as well.   To learn more about what you can do with MyChart, go to [email or web address].    Your next appointment:   4 months with Dr. Debara Pickett

## 2021-07-13 NOTE — Progress Notes (Signed)
LIPID CLINIC CONSULT NOTE  Chief Complaint:  Manage dyslipidemia  Primary Care Physician: McLean-Scocuzza, Nino Glow, MD  Primary Cardiologist:  None  HPI:  Christopher Small is a 46 y.o. male who is being seen today for the evaluation of dyslipidemia at the request of McLean-Scocuzza, Olivia Mackie *.  This is a pleasant 46 year old male is kindly referred for evaluation management of dyslipidemia.  He was previously in the [employer] and [employer] and had worked as a Runner, broadcasting/film/video.  He has a history of stroke in his mother and pacemaker in his maternal grandmother.  He also has a history of high cholesterol most recently with total cholesterol 236, triglycerides 263, HDL 30 and LDL 166.  In the spring was started on atorvastatin but had marked elevation in his CK also pain.  It appears his CK has been elevated at periods as high as 629 in the past and more recently 2006 on statin, improved to 490.  He was then switched to simvastatin which he says has been on since August and seems to be tolerating.  He has not had reassessment of his CK or cholesterol since then.  07/13/2021  Christopher Small is seen today in follow-up.  He seems to be tolerating Zetia.  He has had some improvement in his lipids.  Total cholesterol now 189, triglycerides 154, HDL 36 LDL 122, down from 151.  He did have a coronary calcium score which is elevated for his age.  The absolute score was 7.89, 74 percentile for age and sex matched controls with primary coronary calcium in the right coronary artery.  Based on these findings I would recommend target LDL less than 70.  He is ready on simvastatin 20 mg daily and ezetimibe 10 mg daily.  I had recommended a PCSK9 inhibitor but this was declined.  He still remains well above his target.  I also recommended a repeat CK however that has not been redrawn  PMHx:  Past Medical History:  Diagnosis Date   Anxiety    Chicken pox    COVID-19    06/2020   Hyperlipidemia    Muscle tension dysphonia     [facility]2019 Dr. Beverely Risen   Pneumonia    newborn   Sinusitis    Vocal cord polyps    Vocal cord polyps     Past Surgical History:  Procedure Laterality Date   COLONOSCOPY WITH PROPOFOL N/A 10/22/2018   Procedure: COLONOSCOPY WITH PROPOFOL;  Surgeon: [name], MD;  Location: [facility] ENDOSCOPY;  Service: Gastroenterology;  Laterality: N/A;   ESOPHAGOGASTRODUODENOSCOPY (EGD) WITH PROPOFOL N/A 08/07/2017   Procedure: ESOPHAGOGASTRODUODENOSCOPY (EGD) WITH PROPOFOL;  Surgeon: [name], MD;  Location: [facility] ENDOSCOPY;  Service: Gastroenterology;  Laterality: N/A;   removal vocal cord polyps     06/07/19 [facility] ent Dr. Beverely Risen   ROOT CANAL      FAMHx:  Family History  Problem Relation Age of Onset   Diabetes Mother    Hypertension Mother    Stroke Mother    Alcohol abuse Father    Asthma Brother    Diabetes Paternal Grandmother    Heart disease Paternal Grandmother    Cancer Paternal Grandmother        ?type cancer    Stroke Paternal Grandfather    Cancer Maternal Aunt        >type    Stroke Maternal Uncle    Diabetes Maternal Uncle     SOCHx:   reports that he quit smoking about  4 years ago. His smoking use included cigarettes. He has never used smokeless tobacco. He reports current alcohol use. He reports that he does not currently use drugs.  ALLERGIES:  Allergies  Allergen Reactions   Lipitor [Atorvastatin Calcium]     Muscle weakness and pain and elevated CK    ROS: Pertinent items noted in HPI and remainder of comprehensive ROS otherwise negative.  HOME MEDS: Current Outpatient Medications on File Prior to Visit  Medication Sig Dispense Refill   albuterol (VENTOLIN HFA) 108 (90 Base) MCG/ACT inhaler Inhale 2 puffs into the lungs every 6 (six) hours as needed for wheezing or shortness of breath. 18 g 0   Cholecalciferol (VITAMIN D3) 10 MCG (400 UNIT) tablet Take by mouth.     ezetimibe (ZETIA) 10 MG tablet Take 1 tablet (10 mg total) by mouth daily. 90 tablet  3   Multiple Vitamin (MULTIVITAMIN ADULT PO) Take by mouth.     simvastatin (ZOCOR) 20 MG tablet Take 1 tablet (20 mg total) by mouth at bedtime. 90 tablet 3   traZODone (DESYREL) 50 MG tablet Take 0.5-1 tablets (25-50 mg total) by mouth at bedtime as needed for sleep. 30 tablet 5   tiZANidine (ZANAFLEX) 4 MG tablet Take 1 tablet (4 mg total) by mouth at bedtime as needed for muscle spasms. (Patient not taking: Reported on 07/13/2021) 30 tablet 5   No current facility-administered medications on file prior to visit.    LABS/IMAGING: No results found for this or any previous visit (from the past 48 hour(s)). No results found.  LIPID PANEL:    Component Value Date/Time   CHOL 189 03/03/2021 0733   TRIG 154.0 (H) 03/03/2021 0733   HDL 36.70 (L) 03/03/2021 0733   CHOLHDL 5 03/03/2021 0733   VLDL 30.8 03/03/2021 0733   LDLCALC 122 (H) 03/03/2021 0733   LDLDIRECT 166.0 09/10/2020 0734    WEIGHTS: Wt Readings from Last 3 Encounters:  07/13/21 228 lb 1.6 oz (103.5 kg)  03/04/21 235 lb (106.6 kg)  02/09/21 232 lb 3.2 oz (105.3 kg)    VITALS: BP 138/88   Pulse 89   Ht '5\' 9"'$  (1.753 m)   Wt 228 lb 1.6 oz (103.5 kg)   SpO2 98%   BMI 33.68 kg/m   EXAM: Deferred  EKG: Deferred  ASSESSMENT: Mixed dyslipidemia with high triglycerides Family history of stroke in his mother 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   Christopher Small has a history of statin myopathy but has been tolerating simvastatin without muscle pain.  He did not have a repeat CK.  It may have been only associated with atorvastatin.  He is on Zetia as well but his LDL is still 122.  He does have an elevated coronary calcium score.  He will need more aggressive lipid-lowering.  Options are now limited if he cannot take a PCSK9 inhibitor.  Would pursue Nexletol which possibly could be combined with his ezetimibe.  Plan repeat lipids in 3 to 4 months  follow-up afterwards.  Pixie Casino, MD, [facility], [place] Director of the Advanced Lipid Disorders &  Cardiovascular Risk Reduction Clinic Diplomate of the American Board of Clinical Lipidology Attending Cardiologist  Direct Dial: [phone]  Fax: [phone]  Website:  [email or web address] [email or web address] Monchel Pollitt 07/13/2021, 4:18 PM

## 2021-07-14 ENCOUNTER — Telehealth: Payer: Self-pay

## 2021-07-14 NOTE — Telephone Encounter (Signed)
PA approved for Nexletol by CMM RX#: [id number] through 07/15/2022.

## 2021-07-14 NOTE — Telephone Encounter (Signed)
PA submitted by [facility], Key: [id number], PA Case ID: [id number]

## 2021-07-19 NOTE — Telephone Encounter (Signed)
MyChart message sent to patient w/notice of med approval, reminder to do non-fasting CK prior to starting med and fasting CK+lipid panel before October visit

## 2021-09-16 ENCOUNTER — Ambulatory Visit: Payer: 59 | Admitting: Internal Medicine

## 2021-11-12 ENCOUNTER — Ambulatory Visit (HOSPITAL_BASED_OUTPATIENT_CLINIC_OR_DEPARTMENT_OTHER): Payer: 59 | Admitting: Internal Medicine

## 2022-06-20 ENCOUNTER — Other Ambulatory Visit (HOSPITAL_COMMUNITY): Payer: Self-pay

## 2022-06-27 ENCOUNTER — Telehealth: Payer: Self-pay | Admitting: Internal Medicine

## 2022-06-27 NOTE — Telephone Encounter (Signed)
PA for Nexletol submitted via CMM (Key: [id number])

## 2022-06-27 NOTE — Telephone Encounter (Signed)
PA approved through 06/27/2023

## 2023-03-29 ENCOUNTER — Ambulatory Visit: Payer: 59 | Admitting: Nurse Practitioner

## 2023-03-29 ENCOUNTER — Encounter: Payer: Self-pay | Admitting: Nurse Practitioner

## 2023-03-29 VITALS — BP 120/80 | HR 89 | Temp 98.2°F | Ht 69.0 in | Wt 215.2 lb

## 2023-03-29 DIAGNOSIS — M25532 Pain in left wrist: Secondary | ICD-10-CM | POA: Insufficient documentation

## 2023-03-29 DIAGNOSIS — M545 Low back pain, unspecified: Secondary | ICD-10-CM | POA: Insufficient documentation

## 2023-03-29 DIAGNOSIS — E782 Mixed hyperlipidemia: Secondary | ICD-10-CM | POA: Diagnosis not present

## 2023-03-29 DIAGNOSIS — E559 Vitamin D deficiency, unspecified: Secondary | ICD-10-CM

## 2023-03-29 DIAGNOSIS — F5104 Psychophysiologic insomnia: Secondary | ICD-10-CM | POA: Diagnosis not present

## 2023-03-29 DIAGNOSIS — R7303 Prediabetes: Secondary | ICD-10-CM

## 2023-03-29 DIAGNOSIS — M159 Polyosteoarthritis, unspecified: Secondary | ICD-10-CM

## 2023-03-29 DIAGNOSIS — M25562 Pain in left knee: Secondary | ICD-10-CM | POA: Insufficient documentation

## 2023-03-29 DIAGNOSIS — G8929 Other chronic pain: Secondary | ICD-10-CM

## 2023-03-29 DIAGNOSIS — Z1329 Encounter for screening for other suspected endocrine disorder: Secondary | ICD-10-CM

## 2023-03-29 MED ORDER — IBUPROFEN 800 MG PO TABS: 800.0000 mg | ORAL_TABLET | Freq: Three times a day (TID) | ORAL | 1 refills | Status: DC | PRN

## 2023-03-29 NOTE — Progress Notes (Signed)
 Bethanie Dicker, NP-C Phone: [phone]  Christopher Small is a 48 y.o. male who presents today for transfer of care.   Discussed the use of AI scribe software for clinical note transcription with the patient, who gave verbal consent to proceed.  History of Present Illness   DEKLIN BIELER is a 48 year old male with arthritis who presents for transfer of care and evaluation of joint pain.  He experiences worsening joint pain, particularly in his knees and wrist, which he attributes to arthritis. He recalls being diagnosed with mild arthritis in his back after x-rays. Over the past winter, he noticed severe pain in his knees that persisted and later developed pain in his wrist. He describes the knee pain as severe, stating 'I would have rather been shot,' but notes that the pain has subsided over the past four to five weeks. He occasionally feels slight pain when driving and turning the wheel. He is concerned about his wrist pain, which he suspects might be related to carpal tunnel syndrome, especially given his mother's history of the condition. He finds relief using a compression band on his wrist.  He is not currently taking any medications for arthritis, having stopped previous medications due to adverse effects, including body aches. He mentions that Tylenol Arthritis provided relief for his knee pain within two days. He has a history of high cholesterol and was previously on medications like simvastatin and Zetia, but he stopped due to side effects. He recalls being prescribed a medication that was taken once a week, possibly Nexletol, but is not currently on any cholesterol medications. He was advised to manage his cholesterol through diet and exercise.  He has a history of prediabetes and reports wearing glasses due to worsening vision, which he attributes to aging. No excessive thirst or urination. He acknowledges the need to improve his diet and exercise routine, having previously been active  before a vocal cord surgery halted his workouts. He has since adjusted his diet to include more chicken and fish and less red meat and fried foods.  He describes difficulty unwinding after work, which affects his sleep. He typically sleeps five to six hours a night and feels well-rested, though he occasionally experiences nights where he cannot fall asleep. He has tried trazodone in the past but did not find it effective.  He mentions a history of anxiety and PTSD from his military service, but he does not feel these significantly impact his sleep. He has used lorazepam as needed in the past but rarely took it.  He reports intermittent nerve twitching in various parts of his body, which he attributes to stress. He previously consulted a neurologist who found no abnormalities. The twitching occurs randomly and is not currently a significant issue.      Social History   Tobacco Use  Smoking Status Former   Current packs/day: 0.00   Types: Cigarettes   Quit date: 2019   Years since quitting: 6.1  Smokeless Tobacco Never  Tobacco Comments   smoker since age 9 on and off now 1-2 cig/day max 1/2 ppd     No current outpatient medications on file prior to visit.   No current facility-administered medications on file prior to visit.    ROS see history of present illness  Objective  Physical Exam Vitals:   03/29/23 1513  BP: 120/80  Pulse: 89  Temp: 98.2 F (36.8 C)  SpO2: 94%    BP Readings from Last 3 Encounters:  03/29/23 120/80  07/13/21 138/88  03/04/21 136/78   Wt Readings from Last 3 Encounters:  03/29/23 215 lb 3.2 oz (97.6 kg)  07/13/21 228 lb 1.6 oz (103.5 kg)  03/04/21 235 lb (106.6 kg)    Physical Exam Constitutional:      General: He is not in acute distress.    Appearance: Normal appearance.  HENT:     Head: Normocephalic.  Cardiovascular:     Rate and Rhythm: Normal rate and regular rhythm.     Heart sounds: Normal heart sounds.  Pulmonary:      Effort: Pulmonary effort is normal.     Breath sounds: Normal breath sounds.  Skin:    General: Skin is warm and dry.  Neurological:     General: No focal deficit present.     Mental Status: He is alert.  Psychiatric:        Mood and Affect: Mood normal.        Behavior: Behavior normal.     Assessment/Plan: Please see individual problem list.  Osteoarthritis of multiple joints, unspecified osteoarthritis type Assessment & Plan: Reports indicate worsening arthritis in the back, knees, and wrist, though knee and wrist pain has not been an issue for the past 4-5 weeks and there is no current pain. Refer to orthopedics for further evaluation and management. Take Ibuprofen 800mg  up to three times a day as needed.  Orders: -     CBC with Differential/Platelet; Future -     Sedimentation rate; Future -     Ambulatory referral to Orthopedics -     Ibuprofen; Take 1 tablet (800 mg total) by mouth every 8 (eight) hours as needed for moderate pain (pain score 4-6).  Dispense: 90 tablet; Refill: 1  Chronic bilateral low back pain without sciatica Assessment & Plan: Chronic issue. He has tried muscle relaxers in the past without relief. Lumbar x-ray in 2023 showed mild multilevel spondylosis with endplate spurring. We will refer to Orthopedics for further evaluation and management.   Orders: -     Ambulatory referral to Orthopedics -     Ibuprofen; Take 1 tablet (800 mg total) by mouth every 8 (eight) hours as needed for moderate pain (pain score 4-6).  Dispense: 90 tablet; Refill: 1  Mixed hyperlipidemia Assessment & Plan: There is a history of intolerance to statins and Zetia, and it is unclear if he tried Nexletol. Not currently on any medication. He will return for a fasting lipid panel. Encouraged healthy diet and exercise. The 10-year ASCVD risk score (Arnett DK, et al., 2019) is: 4.9%.   Orders: -     Comprehensive metabolic panel; Future -     Lipid panel; Future  Chronic  insomnia Assessment & Plan: He reports difficulty unwinding and falling asleep, though it does not significantly impact daily function, with occasional nights of restlessness. Recommend trial of over-the-counter sleep aids such as Tylenol PM, Z-Quil, or Unisom for occasional sleepless nights.    Prediabetes Assessment & Plan: With a history of prediabetes and reports of worsening vision but no other symptoms of hyperglycemia, check A1c today. Encouraged healthy diet and regular exercise.   Orders: -     Hemoglobin A1c; Future  Vitamin D deficiency -     VITAMIN D 25 Hydroxy (Vit-D Deficiency, Fractures); Future  Thyroid disorder screen -     TSH; Future   Return for fasting lab work.   Bethanie Dicker, NP-C [facility]

## 2023-04-03 ENCOUNTER — Encounter: Payer: Self-pay | Admitting: Nurse Practitioner

## 2023-04-03 NOTE — Assessment & Plan Note (Signed)
 Reports indicate worsening arthritis in the back, knees, and wrist, though knee and wrist pain has not been an issue for the past 4-5 weeks and there is no current pain. Refer to orthopedics for further evaluation and management. Take Ibuprofen 800mg  up to three times a day as needed.

## 2023-04-03 NOTE — Assessment & Plan Note (Signed)
 Chronic issue. He has tried muscle relaxers in the past without relief. Lumbar x-ray in 2023 showed mild multilevel spondylosis with endplate spurring. We will refer to Orthopedics for further evaluation and management.

## 2023-04-03 NOTE — Assessment & Plan Note (Signed)
 There is a history of intolerance to statins and Zetia, and it is unclear if he tried Nexletol. Not currently on any medication. He will return for a fasting lipid panel. Encouraged healthy diet and exercise. The 10-year ASCVD risk score (Arnett DK, et al., 2019) is: 4.9%.

## 2023-04-03 NOTE — Assessment & Plan Note (Signed)
 He reports difficulty unwinding and falling asleep, though it does not significantly impact daily function, with occasional nights of restlessness. Recommend trial of over-the-counter sleep aids such as Tylenol PM, Z-Quil, or Unisom for occasional sleepless nights.

## 2023-04-03 NOTE — Assessment & Plan Note (Signed)
 With a history of prediabetes and reports of worsening vision but no other symptoms of hyperglycemia, check A1c today. Encouraged healthy diet and regular exercise.

## 2023-04-13 ENCOUNTER — Other Ambulatory Visit (INDEPENDENT_AMBULATORY_CARE_PROVIDER_SITE_OTHER)

## 2023-04-13 ENCOUNTER — Telehealth: Payer: Self-pay

## 2023-04-13 DIAGNOSIS — E559 Vitamin D deficiency, unspecified: Secondary | ICD-10-CM | POA: Diagnosis not present

## 2023-04-13 DIAGNOSIS — R7303 Prediabetes: Secondary | ICD-10-CM

## 2023-04-13 DIAGNOSIS — Z1329 Encounter for screening for other suspected endocrine disorder: Secondary | ICD-10-CM

## 2023-04-13 DIAGNOSIS — M159 Polyosteoarthritis, unspecified: Secondary | ICD-10-CM

## 2023-04-13 DIAGNOSIS — E782 Mixed hyperlipidemia: Secondary | ICD-10-CM | POA: Diagnosis not present

## 2023-04-13 LAB — COMPREHENSIVE METABOLIC PANEL
ALT: 30 U/L (ref 0–53)
AST: 20 U/L (ref 0–37)
Albumin: 4.6 g/dL (ref 3.5–5.2)
Alkaline Phosphatase: 53 U/L (ref 39–117)
BUN: 11 mg/dL (ref 6–23)
CO2: 23 meq/L (ref 19–32)
Calcium: 9.3 mg/dL (ref 8.4–10.5)
Chloride: 104 meq/L (ref 96–112)
Creatinine, Ser: 1.07 mg/dL (ref 0.40–1.50)
GFR: 81.38 mL/min (ref >60.00)
Glucose, Bld: 96 mg/dL (ref 70–99)
Potassium: 3.9 meq/L (ref 3.5–5.1)
Sodium: 136 meq/L (ref 135–145)
Total Bilirubin: 0.7 mg/dL (ref 0.2–1.2)
Total Protein: 6.6 g/dL (ref 6.0–8.3)

## 2023-04-13 LAB — CBC WITH DIFFERENTIAL/PLATELET
Basophils Absolute: 0 10*3/uL (ref 0.0–0.1)
Basophils Relative: 0.4 % (ref 0.0–3.0)
Eosinophils Absolute: 0.2 10*3/uL (ref 0.0–0.7)
Eosinophils Relative: 2.7 % (ref 0.0–5.0)
HCT: 43.3 % (ref 39.0–52.0)
Hemoglobin: 14.3 g/dL (ref 13.0–17.0)
Lymphocytes Relative: 44.7 % (ref 12.0–46.0)
Lymphs Abs: 3.6 10*3/uL (ref 0.7–4.0)
MCHC: 33 g/dL (ref 30.0–36.0)
MCV: 81.6 fl (ref 78.0–100.0)
Monocytes Absolute: 0.4 10*3/uL (ref 0.1–1.0)
Monocytes Relative: 5.6 % (ref 3.0–12.0)
Neutro Abs: 3.7 10*3/uL (ref 1.4–7.7)
Neutrophils Relative %: 46.6 % (ref 43.0–77.0)
Platelets: 284 10*3/uL (ref 150.0–400.0)
RBC: 5.31 Mil/uL (ref 4.22–5.81)
RDW: 15 % (ref 11.5–15.5)
WBC: 7.9 10*3/uL (ref 4.0–10.5)

## 2023-04-13 LAB — LIPID PANEL
Cholesterol: 238 mg/dL — ABNORMAL HIGH (ref 0–200)
HDL: 41.4 mg/dL (ref >39.00)
LDL Cholesterol: 162 mg/dL — ABNORMAL HIGH (ref 0–99)
NonHDL: 196.43
Total CHOL/HDL Ratio: 6
Triglycerides: 172 mg/dL — ABNORMAL HIGH (ref 0.0–149.0)
VLDL: 34.4 mg/dL (ref 0.0–40.0)

## 2023-04-13 LAB — VITAMIN D 25 HYDROXY (VIT D DEFICIENCY, FRACTURES): VITD: 21.7 ng/mL — ABNORMAL LOW (ref 30.00–100.00)

## 2023-04-13 LAB — SEDIMENTATION RATE: Sed Rate: 2 mm/h (ref 0–15)

## 2023-04-13 LAB — HEMOGLOBIN A1C: Hgb A1c MFr Bld: 6.2 % (ref 4.6–6.5)

## 2023-04-13 LAB — TSH: TSH: 1.3 u[IU]/mL (ref 0.35–5.50)

## 2023-04-13 NOTE — Telephone Encounter (Signed)
Documented in lab tab

## 2023-04-13 NOTE — Telephone Encounter (Signed)
 Copied from CRM [id number]. Topic: Clinical - Lab/Test Results >> Apr 13, 2023 10:58 AM Adele Barthel wrote: Reason for CRM:   Patient called in regarding lab results. Results were reviewed and patient had no further questions.   Reports he is not taking Nexletol and does not recall ever taking, but remembers his cardiologist mentioning the medication in 06/2022. He is open to starting it but will require a prescription. He was not sure who would need to prescribe it.   CB#  [phone]

## 2023-05-08 ENCOUNTER — Ambulatory Visit (INDEPENDENT_AMBULATORY_CARE_PROVIDER_SITE_OTHER)

## 2023-05-08 ENCOUNTER — Ambulatory Visit
Admission: EM | Admit: 2023-05-08 | Discharge: 2023-05-08 | Disposition: A | Discharge status: Home / Self Care | Attending: Emergency Medicine | Admitting: Emergency Medicine

## 2023-05-08 DIAGNOSIS — R051 Acute cough: Secondary | ICD-10-CM

## 2023-05-08 DIAGNOSIS — R0789 Other chest pain: Secondary | ICD-10-CM

## 2023-05-08 MED ORDER — IBUPROFEN 600 MG PO TABS: 600.0000 mg | ORAL_TABLET | Freq: Four times a day (QID) | ORAL | 0 refills | Status: AC | PRN

## 2023-05-08 MED ORDER — ALBUTEROL SULFATE HFA 108 (90 BASE) MCG/ACT IN AERS: 2.0000 | INHALATION_SPRAY | RESPIRATORY_TRACT | 0 refills | Status: AC | PRN

## 2023-05-08 MED ORDER — AEROCHAMBER MV MISC: 2 refills | Status: AC

## 2023-05-08 MED ORDER — BACLOFEN 10 MG PO TABS: 10.0000 mg | ORAL_TABLET | Freq: Three times a day (TID) | ORAL | 0 refills | Status: DC

## 2023-05-08 NOTE — ED Triage Notes (Signed)
 Here with Wife. "Starting about 10 days ago with congestion then occasionally cough which I assume was allergies then lower back pain (mid back) and sometimes with deep breathing the pain radiates to front. No Asthma. No COPD. No heart issues/problems.

## 2023-05-08 NOTE — Discharge Instructions (Signed)
 Your chest x-ray did not show any evidence of pneumonia.  I do believe that the pain you are experiencing is a result of inflammation of the muscles of your chest wall on both the front and the back.  Take ibuprofen 600 mg every 6 hours with food to help with muscle pain and inflammation.  Use the baclofen 10 mg every 8 hours to help with muscle pain and tension.  I am also going to prescribe an albuterol inhaler and a spacer and you can use 1 to 2 puffs every 4-6 hours as needed for shortness of breath or wheezing.  If you develop any new or worsening symptoms either return for reevaluation or follow-up with your primary care provider.

## 2023-05-08 NOTE — ED Provider Notes (Signed)
 [facility]    CSN: [id number] Arrival date & time: 05/08/23  1554      History   Chief Complaint Chief Complaint  Patient presents with   Pain   Nasal Congestion    HPI Christopher Small is a 48 y.o. male.   HPI  48 year old male with past medical history significant for elevated CK, prediabetes, anxiety, hyperlipidemia, and chronic insomnia presents for evaluation of 10 days worth of chest congestion with 5/10 mid back pain on the left.  The pain does increase with deep breathing but not with movement.  Patient also indicates that he has had a raspy throat and dry throat and indicates shortness of breath and wheezing.  He does have a cough but it is only intermittent and it is productive for sputum.  He denies any fever, runny nose, nasal congestion, or ear pain.  Past Medical History:  Diagnosis Date   Anxiety    Belching 07/12/2017   Chicken pox    COVID-19    06/2020   Gastroesophageal reflux disease 07/12/2017   Hoarseness 07/12/2017   Hyperlipidemia    Loose stools 08/14/2017   Muscle tension dysphonia    [facility] 05/2019 Dr. 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   Lumbar spondylosis 03/12/2021   Globus sensation 01/22/2021   Tachycardia 01/22/2021   Chronic insomnia 09/01/2020   Annual physical exam 01/22/2019   Family history of colonic polyps 10/30/2018   Polyp of colon 10/30/2018   Elevated CK 09/26/2018   Prediabetes  04/13/2018   Chest pain 01/12/2018   Anxiety 10/13/2017   Stress 10/13/2017   Muscle spasm 10/13/2017   HLD (hyperlipidemia) 08/14/2017   Vitamin D deficiency 08/14/2017    Past Surgical History:  Procedure Laterality Date   COLONOSCOPY WITH PROPOFOL N/A 10/22/2018   Procedure: COLONOSCOPY WITH PROPOFOL;  Surgeon: Wyline Mood, MD;  Location: [facility] ENDOSCOPY;  Service: Gastroenterology;  Laterality: N/A;   ESOPHAGOGASTRODUODENOSCOPY (EGD) WITH PROPOFOL N/A 08/07/2017   Procedure: ESOPHAGOGASTRODUODENOSCOPY (EGD) WITH PROPOFOL;  Surgeon: Wyline Mood, MD;  Location: [facility] ENDOSCOPY;  Service: Gastroenterology;  Laterality: N/A;   removal vocal cord polyps     06/07/19 [facility] ent Dr. Vergie Living   ROOT CANAL         Home Medications    Prior to Admission medications   Medication Sig Start Date End Date Taking? Authorizing Provider  albuterol (VENTOLIN HFA) 108 (90 Base) MCG/ACT inhaler Inhale 2 puffs into the lungs every 4 (four) hours as needed. 05/08/23  Yes Becky Augusta, NP  baclofen (LIORESAL) 10 MG tablet Take 1 tablet (10 mg total) by mouth 3 (three) times daily. 05/08/23  Yes Becky Augusta, NP  ibuprofen (ADVIL) 600 MG tablet Take 1 tablet (600 mg total) by mouth every 6 (six) hours as needed. 05/08/23  Yes Becky Augusta, NP  Spacer/Aero-Holding Chambers (AEROCHAMBER MV) inhaler Use as instructed  05/08/23  Yes Kent Pear, NP    Family History Family History  Problem Relation Age of Onset   Diabetes Mother    Hypertension Mother    Stroke Mother    Alcohol abuse Father    Asthma Brother    Diabetes Paternal Grandmother    Heart disease Paternal Grandmother    Cancer Paternal Grandmother        ?type cancer    Stroke Paternal Grandfather    Cancer Maternal Aunt        >type    Stroke Maternal Uncle    Diabetes Maternal Uncle     Social History Social History   Tobacco Use   Smoking status: Former    Current packs/day: 0.00    Types: Cigarettes    Quit date: 2019     Years since quitting: 6.2   Smokeless tobacco: Never   Tobacco comments:    smoker since age 89 on and off now 1-2 cig/day max 1/2 ppd   Vaping Use   Vaping status: Every Day   Substances: Nicotine, Flavoring  Substance Use Topics   Alcohol use: Yes    Comment: social   Drug use: Not Currently    Comment: thc sometimes      Allergies   Lipitor [atorvastatin calcium]   Review of Systems Review of Systems  Constitutional:  Negative for fever.  HENT:  Positive for voice change. Negative for congestion, ear pain and rhinorrhea.   Respiratory:  Positive for cough, shortness of breath and wheezing.      Physical Exam Triage Vital Signs ED Triage Vitals  Encounter Vitals Group     BP --      Systolic BP Percentile --      Diastolic BP Percentile --      Pulse --      Resp --      Temp --      Temp src --      SpO2 --      Weight 05/08/23 1614 210 lb (95.3 kg)     Height 05/08/23 1614 5\' 9"  (1.753 m)     Head Circumference --      Peak Flow --      Pain Score 05/08/23 1610 0     Pain Loc --      Pain Education --      Exclude from Growth Chart --    No data found.  Updated Vital Signs BP 136/77 (BP Location: Left Arm)   Pulse 78   Temp 97.8 F (36.6 C) (Oral)   Resp 18   Ht 5\' 9"  (1.753 m)   Wt 210 lb (95.3 kg)   SpO2 97%   BMI 31.01 kg/m   Visual Acuity Right Eye Distance:   Left Eye Distance:   Bilateral Distance:    Right Eye Near:   Left Eye Near:    Bilateral Near:     Physical Exam Vitals and nursing note reviewed.  Constitutional:      Appearance: Normal appearance. He is not ill-appearing.  HENT:     Head: Normocephalic and atraumatic.  Cardiovascular:     Rate and Rhythm: Normal rate and regular rhythm.     Pulses: Normal pulses.     Heart sounds: Normal heart sounds. No murmur heard.    No friction rub. No gallop.  Pulmonary:     Effort: Pulmonary effort is normal.     Breath sounds: Normal breath sounds. No wheezing, rhonchi or  rales.  Chest:     Chest wall: No tenderness.  Skin:    General: Skin is warm and dry.     Capillary Refill: Capillary refill takes less than 2 seconds.     Findings: No rash.  Neurological:     General: No focal deficit present.     Mental Status: He is alert and oriented to person, place, and time.      UC Treatments / Results  Labs (all labs ordered are listed, but only abnormal results are displayed) Labs Reviewed - No data to display  EKG   Radiology DG Chest 2 View Result Date: 05/08/2023 CLINICAL DATA:  Cough and back pain. EXAM: CHEST - 2 VIEW COMPARISON:  Chest radiograph dated 01/14/2018. FINDINGS: The heart size and mediastinal contours are within normal limits. Both lungs are clear. No pleural effusion or pneumothorax. No acute osseous abnormality. IMPRESSION: No acute cardiopulmonary findings. Electronically Signed   By: Hart Robinsons M.D.   On: 05/08/2023 18:46    Procedures Procedures (including critical care time)  Medications Ordered in UC Medications - No data to display  Initial Impression / Assessment and Plan / UC Course  I have reviewed the triage vital signs and the nursing notes.  Pertinent labs & imaging results that were available during my care of the patient were reviewed by me and considered in my medical decision making (see chart for details).   Patient is a nontoxic-appearing 48 year old male presenting for evaluation of respiratory symptoms as outlined HPI above.  In the exam room he is able to speak in full sentences dyspnea or tachypnea.  He indicates that his voice is raspy though I do not notice any catches or breaks in his voice.  Cardiopulmonary exam reveals S1-S2 heart sounds with regular rate and rhythm and lung sounds are clear to auscultation in all fields.  However, when he takes a deep breath he does feel pain in the mid chest near the sternum that radiates around to the back.  I will order a chest x-ray to evaluate for any acute  cardiopulmonary pathology.  Chest x-ray independently reviewed and evaluated by me.  Impression: Vascular congestion present in both lung fields and the patient has an elongated, boot-shaped heart.  Cardiomediastinal silhouette is otherwise within normal limits.  No definitive effusion or infiltrate.  Radiology overread is pending. Radiology impression states no acute cardiopulmonary findings.  [facility] Radiology reading room to inquire about radiology interpretation.  They will forward chest x-ray to a radiologist.  I will discharge the patient home with a diagnosis of musculoskeletal chest wall pain and treat him with ibuprofen 600 mg every 6 6 hours along with baclofen 10 mg every 8 hours to help with muscle pain and tension.  I will also prescribe an albuterol inhaler and spacer and he can take 1 to 2 puffs every 4-6 hours as needed for shortness of breath or wheezing.  Return precautions reviewed.  Work note provided.   Final Clinical Impressions(s) / UC Diagnoses   Final diagnoses:  Acute cough  Left-sided chest wall pain     Discharge Instructions      Your chest x-ray did not show any evidence of pneumonia.  I do believe that the pain you are experiencing is a result of inflammation of the muscles of your chest wall on both the front and the back.  Take ibuprofen 600 mg every 6 hours with food to help with muscle pain and inflammation.  Use the baclofen  10 mg every 8 hours to help with muscle pain and tension.  I am also going to prescribe an albuterol inhaler and a spacer and you can use 1 to 2 puffs every 4-6 hours as needed for shortness of breath or wheezing.  If you develop any new or worsening symptoms either return for reevaluation or follow-up with your primary care provider.     ED Prescriptions     Medication Sig Dispense Auth. Provider   ibuprofen (ADVIL) 600 MG tablet Take 1 tablet (600 mg total) by mouth every 6 (six) hours as needed. 30 tablet  Kent Pear, NP   baclofen (LIORESAL) 10 MG tablet Take 1 tablet (10 mg total) by mouth 3 (three) times daily. 30 each Kent Pear, NP   Spacer/Aero-Holding Chambers (AEROCHAMBER MV) inhaler Use as instructed 1 each Kent Pear, NP   albuterol (VENTOLIN HFA) 108 (90 Base) MCG/ACT inhaler Inhale 2 puffs into the lungs every 4 (four) hours as needed. 18 g Kent Pear, NP      PDMP not reviewed this encounter.   Kent Pear, NP 05/08/23 [phone]

## 2023-05-09 ENCOUNTER — Ambulatory Visit: Payer: Self-pay

## 2023-05-09 ENCOUNTER — Telehealth: Payer: Self-pay

## 2023-05-09 NOTE — Telephone Encounter (Signed)
 Mychart msg sent informing pt not to take per provider

## 2023-05-09 NOTE — Telephone Encounter (Signed)
 Was unable to LVM but did send message via my chart as well to inform pt that per Lorice Roof xray was normal without any findings

## 2023-05-09 NOTE — Telephone Encounter (Addendum)
 3rd call attempt unsuccessful, voicemail box has not been set up, unable to leave message. Routing to office for no contact X 3 follow up. 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   2nd call attempt unsuccessful, voicemail box has not been set up, unable to leave message.

## 2023-05-09 NOTE — Telephone Encounter (Signed)
 Attempted to contact patient, has vm that has not been set up so unable to leave message.  Copied from CRM [id number]. Topic: Clinical - Medical Advice >> May 09, 2023 10:05 AM Alyse July wrote: Reason for CRM: Patient was prescribed 2 different pain medications and wants to know if he can take them both at the same time ibuprofen 800 and Advil 600?

## 2023-05-09 NOTE — Telephone Encounter (Signed)
 Called pt to get more information has results was given to pt at [facility] on 04/28/2023, but there was not vm set up to leave a message for pt to call the office back.    Copied from CRM [id number]. Topic: Clinical - Medication Question >> May 09, 2023  8:18 AM Christopher Small wrote: Reason for CRM: pt called to speak with provider requesting she take a look at his recent xray, call him with a updated at [phone]

## 2023-05-27 ENCOUNTER — Emergency Department

## 2023-05-27 ENCOUNTER — Emergency Department
Admission: EM | Admit: 2023-05-27 | Discharge: 2023-05-27 | Disposition: A | Discharge status: Home / Self Care | Attending: Emergency Medicine | Admitting: Emergency Medicine

## 2023-05-27 DIAGNOSIS — M545 Low back pain, unspecified: Secondary | ICD-10-CM | POA: Insufficient documentation

## 2023-05-27 MED ORDER — MELOXICAM 15 MG PO TABS: 15.0000 mg | ORAL_TABLET | Freq: Every day | ORAL | 0 refills | Status: AC

## 2023-05-27 NOTE — ED Provider Triage Note (Signed)
 Emergency Medicine Provider Triage Evaluation Note  Christopher Small , a 48 y.o. male  was evaluated in triage.  Pt complains of low back pain.  Bent over to tie his shoes had severe pain in the right lower back.  No numbness or tingling.  No loss of bowel or bladder control.  Review of Systems  Positive:  Negative:   Physical Exam  BP 131/83 (BP Location: Left Arm)   Pulse 79   Temp 98.2 F (36.8 C) (Oral)   Resp 18   SpO2 99%  Gen:   Awake, no distress   Resp:  Normal effort  MSK:   Moves extremities without difficulty  Other:    Medical Decision Making  Medically screening exam initiated at 1:54 PM.  Appropriate orders placed.  Christopher Small was informed that the remainder of the evaluation will be completed by another provider, this initial triage assessment does not replace that evaluation, and the importance of remaining in the ED until their evaluation is complete.     Delsie Figures, PA-C 05/27/23 1355

## 2023-05-27 NOTE — Discharge Instructions (Addendum)
 You can take 650 mg of Tylenol every 6 hours as needed for pain. You can use ice, heat, muscle creams and other topical pain relievers as well.  Please take the meloxicam  (Mobic ) once a day for 2 weeks.  This is an anti-inflammatory.  Do not take other NSAIDs while taking this medication.  NSAIDs include ibuprofen , Motrin , Advil , naproxen, Aleve, celecoxib, and Celebrex.  The muscle relaxer can be taken every 8 hours as needed for muscle spasms. This medication is sedating, so do not drive for 8 hours after taking it.   Return to the ED with any worsening symptoms.

## 2023-05-27 NOTE — ED Notes (Signed)
 Pt was discharged by provider

## 2023-05-27 NOTE — ED Triage Notes (Signed)
 Pt to ED from home with pain in right side of his lower back since bending over this morning. Pt states his back feels "tight" and gets worse with movement. Took Baclofen  for pain around 1300.

## 2023-05-27 NOTE — ED Provider Notes (Signed)
 [facility] Provider Note    Event Date/Time   First MD Initiated Contact with Patient 05/27/23 1537     (approximate)   History   Back Pain   HPI  Christopher Small is a 48 y.o. male with PMH of GERD anxiety and arthritis of the lumbar spine who presents for evaluation of right sided lower back pain that began this morning.  Patient states he bent over to put his shoes on and had a very difficult time standing back up.  Patient describes his back as feeling tight and states it is worse with movement.  Patient has taken baclofen  for pain prior to arrival.  Denies saddle anesthesia and changes in bladder or bowel function.  Patient denies any radiation of the pain down his legs as well as numbness and weakness in the legs.      Physical Exam   Triage Vital Signs: ED Triage Vitals  Encounter Vitals Group     BP 05/27/23 1353 131/83     Systolic BP Percentile --      Diastolic BP Percentile --      Pulse Rate 05/27/23 1353 79     Resp 05/27/23 1353 18     Temp 05/27/23 1353 98.2 F (36.8 C)     Temp Source 05/27/23 1353 Oral     SpO2 05/27/23 1353 99 %     Weight --      Height --      Head Circumference --      Peak Flow --      Pain Score 05/27/23 1354 10     Pain Loc --      Pain Education --      Exclude from Growth Chart --     Most recent vital signs: Vitals:   05/27/23 1353  BP: 131/83  Pulse: 79  Resp: 18  Temp: 98.2 F (36.8 C)  SpO2: 99%   General: Awake, no distress.  CV:  Good peripheral perfusion.  RRR. Resp:  Normal effort.  CTAB. Abd:  No distention.  Other:  No tenderness to palpation over the spine or paraspinal muscles.  Sensation maintained in all dermatomes of the bilateral lower extremities.  Posterior tibialis pulses 2+ and regular bilaterally.  Strength is 5/5 and equal in bilateral lower extremities.   ED Results / Procedures / Treatments   Labs (all labs ordered are listed, but only abnormal results are  displayed) Labs Reviewed - No data to display   RADIOLOGY  Lumbar x-ray obtained, I interpreted the images as well as reviewed the radiologist report which did not show any fractures but did note degenerative changes at L4-L5 and L5-S1.   PROCEDURES:  Critical Care performed: No  Procedures   MEDICATIONS ORDERED IN ED: Medications - No data to display   IMPRESSION / MDM / ASSESSMENT AND PLAN / ED COURSE  I reviewed the triage vital signs and the nursing notes.                             48 year old male presents for evaluation of right lower back pain.  Vital signs are stable patient NAD on exam.  Differential diagnosis includes, but is not limited to, muscle strain, lumbar radiculopathy, sciatica.  Patient's presentation is most consistent with acute complicated illness / injury requiring diagnostic workup.  Lumbar x-ray is negative for any acute abnormalities.  Physical exam is reassuring.  Patient denies  red flag symptoms so no suspicion for cauda equina at this time.  Believe patient's pain is a result of a muscle strain.  Recommended multimodal treatment including anti-inflammatory, pain reliever with Tylenol, muscle relaxer, topical pain relievers as well as ice and heat.  Patient was given a note for work just in case.  Patient voiced understanding, all questions were answered and he was stable at discharge.    FINAL CLINICAL IMPRESSION(S) / ED DIAGNOSES   Final diagnoses:  Acute right-sided low back pain without sciatica     Rx / DC Orders   ED Discharge Orders          Ordered    meloxicam  (MOBIC ) 15 MG tablet  Daily        05/27/23 1546             Note:  This document was prepared using Dragon voice recognition software and may include unintentional dictation errors.   Phyliss Breen, PA-C 05/27/23 1555    Lind Repine, MD 05/27/23 [phone]

## 2023-05-29 ENCOUNTER — Telehealth: Payer: Self-pay | Admitting: Pharmacy Technician

## 2023-05-29 NOTE — Telephone Encounter (Signed)
 Received cmm for renewal of pa for nexletol  per note pt never started and rx is expired

## 2023-09-13 ENCOUNTER — Other Ambulatory Visit: Payer: Self-pay | Admitting: Nurse Practitioner

## 2023-09-13 ENCOUNTER — Telehealth: Payer: Self-pay

## 2023-09-13 DIAGNOSIS — M62838 Other muscle spasm: Secondary | ICD-10-CM

## 2023-09-13 NOTE — Telephone Encounter (Unsigned)
 Copied from CRM [id number]. Topic: Clinical - Medication Refill >> Sep 13, 2023 12:41 PM Mercedes MATSU wrote: Medication: baclofen  (LIORESAL ) 10 MG tablet  Has the patient contacted their pharmacy? Yes (Agent: If no, request that the patient contact the pharmacy for the refill. If patient does not wish to contact the pharmacy document the reason why and proceed with request.) (Agent: If yes, when and what did the pharmacy advise?)  This is the patient's preferred pharmacy:  [facility] [address]), [place] - [address] [place]ACOBS No Name) [place] [address] Phone: [phone] Fax: [phone]  Is this the correct pharmacy for this prescription? Yes If no, delete pharmacy and type the correct one.   Has the prescription been filled recently? Yes  Is the patient out of the medication? Yes  Has the patient been seen for an appointment in the last year OR does the patient have an upcoming appointment? Yes  Can we respond through MyChart? Yes  Agent: Please be advised that Rx refills may take up to 3 business days. We ask that you follow-up with your pharmacy.

## 2023-09-13 NOTE — Telephone Encounter (Signed)
 Copied from CRM #[id number]. Topic: General - Other >> Sep 13, 2023 12:43 PM Mercedes MATSU wrote: Reason for CRM: Patient requesting a call back from NP [facility] and can be reached on mychart instead of calling due to him being at work. Patient states he was referred to cardiology and that he cant get in touch with them. He is hoping NP [place] can. Patient is also wanting to ask other questions to the nurse as well.

## 2023-09-15 ENCOUNTER — Encounter: Payer: Self-pay | Admitting: Family Medicine

## 2023-09-15 ENCOUNTER — Ambulatory Visit: Admitting: Family Medicine

## 2023-09-15 VITALS — BP 132/80 | HR 99 | Temp 98.5°F | Ht 69.0 in | Wt 225.0 lb

## 2023-09-15 DIAGNOSIS — E782 Mixed hyperlipidemia: Secondary | ICD-10-CM | POA: Diagnosis not present

## 2023-09-15 DIAGNOSIS — M791 Myalgia, unspecified site: Secondary | ICD-10-CM | POA: Diagnosis not present

## 2023-09-15 DIAGNOSIS — R110 Nausea: Secondary | ICD-10-CM

## 2023-09-15 DIAGNOSIS — M545 Low back pain, unspecified: Secondary | ICD-10-CM | POA: Diagnosis not present

## 2023-09-15 DIAGNOSIS — T466X5A Adverse effect of antihyperlipidemic and antiarteriosclerotic drugs, initial encounter: Secondary | ICD-10-CM

## 2023-09-15 MED ORDER — BACLOFEN 10 MG PO TABS: 10.0000 mg | ORAL_TABLET | Freq: Three times a day (TID) | ORAL | 0 refills | Status: AC

## 2023-09-15 NOTE — Assessment & Plan Note (Signed)
 Acute on chronic issue. Likely secondary to osteoarthritis seen on plain film at ED.  Also associated muscle spasm. Given recent NSAID use likely irritating stomach I have encouraged him to use Tylenol extra strength 2 tablets twice daily as needed for pain as well as muscle relaxant that has already been prescribed. Also recommend heat and to start home physical therapy.  He will follow-up with PCP for further recommendations if not improving as expected.

## 2023-09-15 NOTE — Assessment & Plan Note (Signed)
 Acute, now resolved. Symptoms most likely secondary to GERD or gastritis in setting of NSAID use.  Recommended avoidance of NSAIDs and reflux triggers.  Can use Pepcid AC twice daily for the next several days to assist with symptoms.

## 2023-09-15 NOTE — Telephone Encounter (Signed)
 Noted. Will see patient today for acute issues.

## 2023-09-15 NOTE — Progress Notes (Signed)
 Patient ID: Christopher Small, male    DOB: 1975-10-18, 48 y.o.   MRN: [id number]  This visit was conducted in person.  BP 132/80   Pulse 99   Temp 98.5 F (36.9 C) (Temporal)   Ht 5' 9 (1.753 m)   Wt 225 lb (102.1 kg)   SpO2 98%   BMI 33.23 kg/m    CC:  Chief Complaint  Patient presents with   Back Pain    Back pain and spasms ongoing and worsening    Nausea    Episodes of nausea started on Tuesday intermittently until Thursday. Denies vomiting.     Subjective:   HPI: Christopher Small is a 48 y.o. male  patient of Leron Glance, NP  with history of  osteoarthritis multiple joints and chronic bilateral low back pain presenting on 09/15/2023 for Back Pain (Back pain and spasms ongoing and worsening ) and Nausea (Episodes of nausea started on Tuesday intermittently until Thursday. Denies vomiting. )   He reports back  pain acute, right, worsening   Seen in ED on 5/3/205  Has been sent in rx of baclofen .  No radiated pain.   Has been using ibuprofen  600 mg   in last 2 days.... few weeks ago taking it daily.   He also reports intermittent episodes of nausea in the last week.  Note Monday and Wednesday. Some associated heartburn... took some pepto bismol... this may have helped.  No vomiting.   Also has high cholesterol   Started on statin by Dr. [facility] 06/2021... had significant SE. Lab Results  Component Value Date   CHOL 238 (H) 04/13/2023   HDL 41.40 04/13/2023   LDLCALC 162 (H) 04/13/2023   LDLDIRECT 166.0 09/10/2020   TRIG 172.0 (H) 04/13/2023   CHOLHDL 6 04/13/2023   Per last note.SABRA appear he was prescribed Nexletol  and zetia .      Relevant past medical, surgical, family and social history reviewed and updated as indicated. Interim medical history since our last visit reviewed. Allergies and medications reviewed and updated. Outpatient Medications Prior to Visit  Medication Sig Dispense Refill   albuterol  (VENTOLIN  HFA) 108 (90 Base) MCG/ACT inhaler  Inhale 2 puffs into the lungs every 4 (four) hours as needed. 18 g 0   baclofen  (LIORESAL ) 10 MG tablet Take 1 tablet (10 mg total) by mouth 3 (three) times daily. 30 each 0   ibuprofen  (ADVIL ) 600 MG tablet Take 1 tablet (600 mg total) by mouth every 6 (six) hours as needed. 30 tablet 0   Spacer/Aero-Holding Chambers (AEROCHAMBER MV) inhaler Use as instructed 1 each 2   No facility-administered medications prior to visit.     Per HPI unless specifically indicated in ROS section below Review of Systems  Constitutional:  Negative for fatigue and fever.  HENT:  Negative for ear pain.   Eyes:  Negative for pain.  Respiratory:  Negative for cough and shortness of breath.   Cardiovascular:  Negative for chest pain, palpitations and leg swelling.  Gastrointestinal:  Positive for nausea. Negative for abdominal pain.  Genitourinary:  Negative for dysuria.  Musculoskeletal:  Positive for back pain. Negative for arthralgias.  Neurological:  Negative for syncope, light-headedness and headaches.  Psychiatric/Behavioral:  Negative for dysphoric mood.    Objective:  BP 132/80   Pulse 99   Temp 98.5 F (36.9 C) (Temporal)   Ht 5' 9 (1.753 m)   Wt 225 lb (102.1 kg)   SpO2 98%   BMI 33.23  kg/m   Wt Readings from Last 3 Encounters:  09/15/23 225 lb (102.1 kg)  05/08/23 210 lb (95.3 kg)  03/29/23 215 lb 3.2 oz (97.6 kg)      Physical Exam Vitals reviewed.  Constitutional:      Appearance: He is well-developed.  HENT:     Head: Normocephalic.     Right Ear: Hearing normal.     Left Ear: Hearing normal.     Nose: Nose normal.  Neck:     Thyroid : No thyroid  mass or thyromegaly.     Vascular: No carotid bruit.     Trachea: Trachea normal.  Cardiovascular:     Rate and Rhythm: Normal rate and regular rhythm.     Pulses: Normal pulses.     Heart sounds: Heart sounds not distant. No murmur heard.    No friction rub. No gallop.     Comments: No peripheral edema Pulmonary:     Effort:  Pulmonary effort is normal. No respiratory distress.     Breath sounds: Normal breath sounds.  Musculoskeletal:     Lumbar back: Spasms and tenderness present. No bony tenderness. Normal range of motion. Negative right straight leg raise test and negative left straight leg raise test.  Skin:    General: Skin is warm and dry.     Findings: No rash.  Psychiatric:        Speech: Speech normal.        Behavior: Behavior normal.        Thought Content: Thought content normal.       Results for orders placed or performed in visit on 04/13/23  VITAMIN D  25 Hydroxy (Vit-D Deficiency, Fractures)   Collection Time: 04/13/23  7:33 AM  Result Value Ref Range   VITD 21.70 (L) 30.00 - 100.00 ng/mL  Sedimentation rate   Collection Time: 04/13/23  7:33 AM  Result Value Ref Range   Sed Rate 2 0 - 15 mm/hr  Hemoglobin A1c   Collection Time: 04/13/23  7:33 AM  Result Value Ref Range   Hgb A1c MFr Bld 6.2 4.6 - 6.5 %  TSH   Collection Time: 04/13/23  7:33 AM  Result Value Ref Range   TSH 1.30 0.35 - 5.50 uIU/mL  Lipid panel   Collection Time: 04/13/23  7:33 AM  Result Value Ref Range   Cholesterol 238 (H) 0 - 200 mg/dL   Triglycerides 827.9 (H) 0.0 - 149.0 mg/dL   HDL 58.59 >60.99 mg/dL   VLDL 65.5 0.0 - 59.9 mg/dL   LDL Cholesterol 837 (H) 0 - 99 mg/dL   Total CHOL/HDL Ratio 6    NonHDL 196.43   Comprehensive metabolic panel   Collection Time: 04/13/23  7:33 AM  Result Value Ref Range   Sodium 136 135 - 145 mEq/L   Potassium 3.9 3.5 - 5.1 mEq/L   Chloride 104 96 - 112 mEq/L   CO2 23 19 - 32 mEq/L   Glucose, Bld 96 70 - 99 mg/dL   BUN 11 6 - 23 mg/dL   Creatinine, Ser 8.92 0.40 - 1.50 mg/dL   Total Bilirubin 0.7 0.2 - 1.2 mg/dL   Alkaline Phosphatase 53 39 - 117 U/L   AST 20 0 - 37 U/L   ALT 30 0 - 53 U/L   Total Protein 6.6 6.0 - 8.3 g/dL   Albumin 4.6 3.5 - 5.2 g/dL   GFR 18.61 >39.99 mL/min   Calcium  9.3 8.4 - 10.5 mg/dL  CBC with Differential/Platelet  Collection Time:  04/13/23  7:33 AM  Result Value Ref Range   WBC 7.9 4.0 - 10.5 K/uL   RBC 5.31 4.22 - 5.81 Mil/uL   Hemoglobin 14.3 13.0 - 17.0 g/dL   HCT 56.6 60.9 - 47.9 %   MCV 81.6 78.0 - 100.0 fl   MCHC 33.0 30.0 - 36.0 g/dL   RDW 84.9 88.4 - 84.4 %   Platelets 284.0 150.0 - 400.0 K/uL   Neutrophils Relative % 46.6 43.0 - 77.0 %   Lymphocytes Relative 44.7 12.0 - 46.0 %   Monocytes Relative 5.6 3.0 - 12.0 %   Eosinophils Relative 2.7 0.0 - 5.0 %   Basophils Relative 0.4 0.0 - 3.0 %   Neutro Abs 3.7 1.4 - 7.7 K/uL   Lymphs Abs 3.6 0.7 - 4.0 K/uL   Monocytes Absolute 0.4 0.1 - 1.0 K/uL   Eosinophils Absolute 0.2 0.0 - 0.7 K/uL   Basophils Absolute 0.0 0.0 - 0.1 K/uL    Assessment and Plan  Mixed hyperlipidemia Assessment & Plan: Chronic, inadequate control at last check.  Patient not on any medication.  In the past patient has been intolerant of statins. Per last cardiology note from 2023 Dr. Mona recommended Nexletol  and Zetia  but he never started it.  Discussed patient contacting Dr. Mona for follow-up.  I will place a referral to lipid clinic for consideration of medication such as PCKS9 inhibitor versus initial recommended regimen of Nexletol  and Zetia .  Orders: -     AMB Referral to [facility]  Myalgia due to statin -     AMB Referral to [facility]  Acute midline low back pain without sciatica Assessment & Plan: Acute on chronic issue. Likely secondary to osteoarthritis seen on plain film at ED.  Also associated muscle spasm. Given recent NSAID use likely irritating stomach I have encouraged him to use Tylenol extra strength 2 tablets twice daily as needed for pain as well as muscle relaxant that has already been prescribed. Also recommend heat and to start home physical therapy.  He will follow-up with PCP for further recommendations if not improving as expected.   Nausea Assessment & Plan: Acute, now resolved. Symptoms most likely  secondary to GERD or gastritis in setting of NSAID use.  Recommended avoidance of NSAIDs and reflux triggers.  Can use Pepcid AC twice daily for the next several days to assist with symptoms.     No follow-ups on file.   Greig Ring, MD

## 2023-09-15 NOTE — Assessment & Plan Note (Signed)
 Chronic, inadequate control at last check.  Patient not on any medication.  In the past patient has been intolerant of statins. Per last cardiology note from 2023 Dr. Mona recommended Nexletol  and Zetia  but he never started it.  Discussed patient contacting Dr. Mona for follow-up.  I will place a referral to lipid clinic for consideration of medication such as PCKS9 inhibitor versus initial recommended regimen of Nexletol  and Zetia .

## 2023-10-26 IMAGING — CT CT CARDIAC CORONARY ARTERY CALCIUM SCORE
3 series · 14 of 20 positions shown, 16 images · non-contrast
Comparison: 01/14/2018 chest radiograph

Addendum:
CLINICAL DATA: Cardiovascular Disease Risk stratification

EXAM:
Coronary Calcium Score
TECHNIQUE: A gated, non-contrast computed tomography scan of the heart was
performed using 3mm slice thickness. Axial images were analyzed on a
dedicated workstation. Calcium scoring of the coronary arteries was
performed using the Agatston method.
MEDICATIONS:
MEDICATIONS
None

[Series 2: cascseq 2.0 sa36 (id) (id) · axial · 0.39mm/px · z∈[-225,-145]mm · 4 of 68 slices shown]
[im 14/68  vessel]
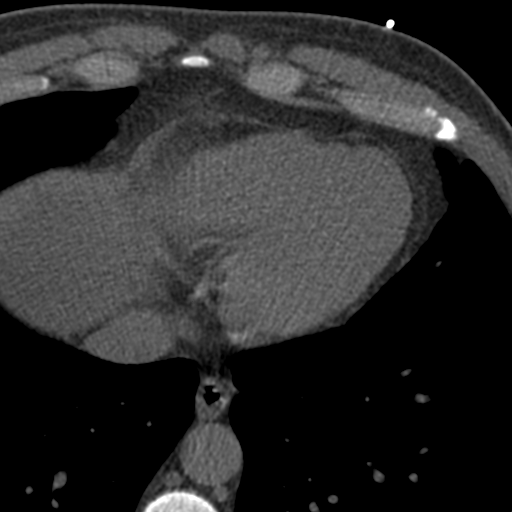
[im 27/68  vessel]
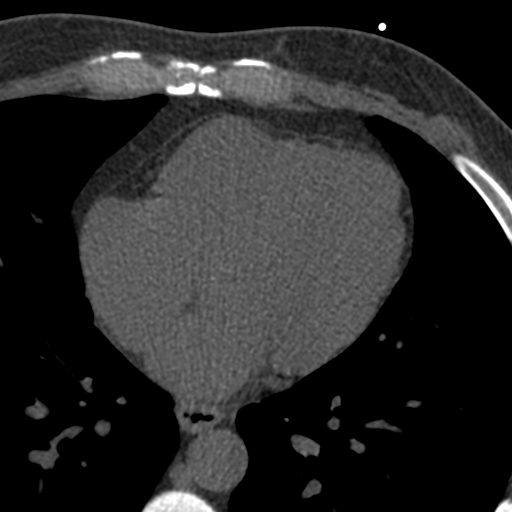
[im 41/68  vessel]
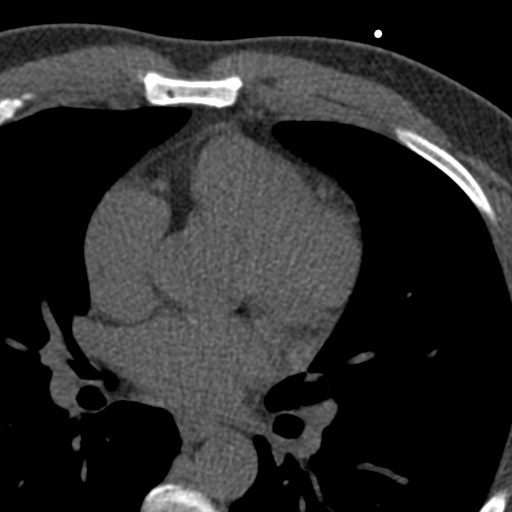
[im 54/68  vessel]
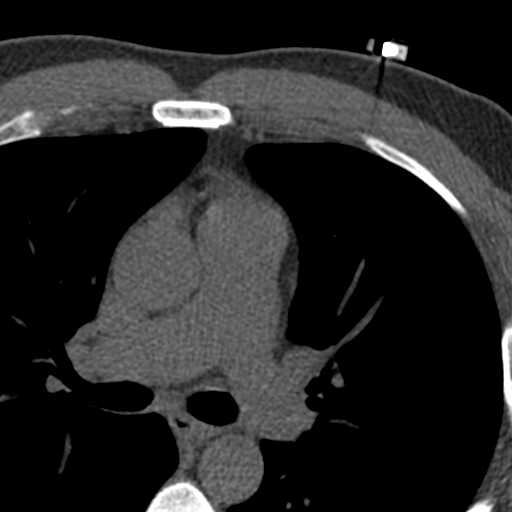

[Series 3: cascseq 2.0 bf37 st · axial · 0.73mm/px · z∈[-229,-141]mm · 5 of 68 slices shown, 7 images]
[im 12/68  vessel]
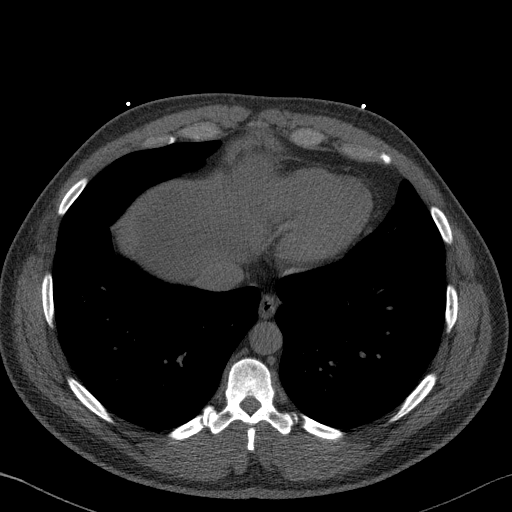
[im 12/68  lung]
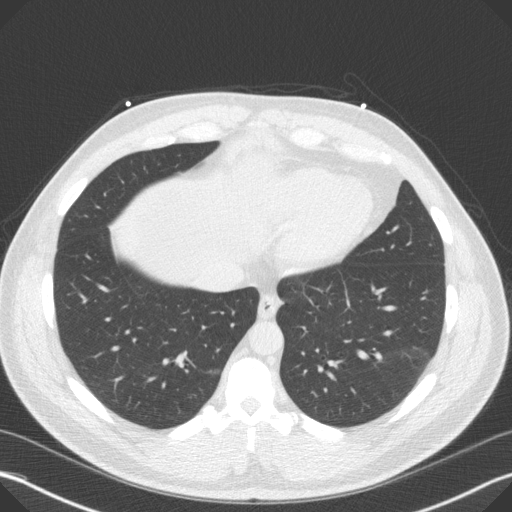
[im 23/68  vessel]
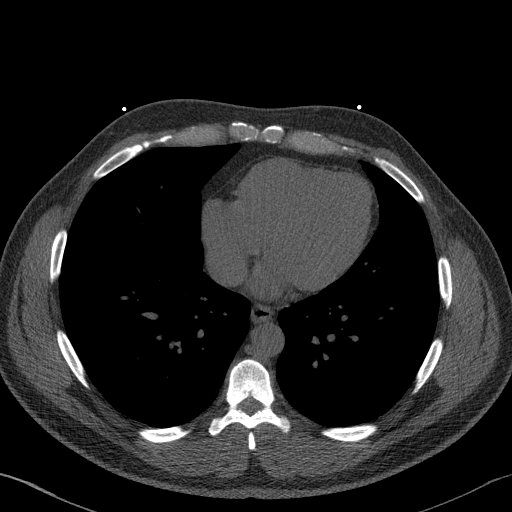
[im 34/68  vessel]
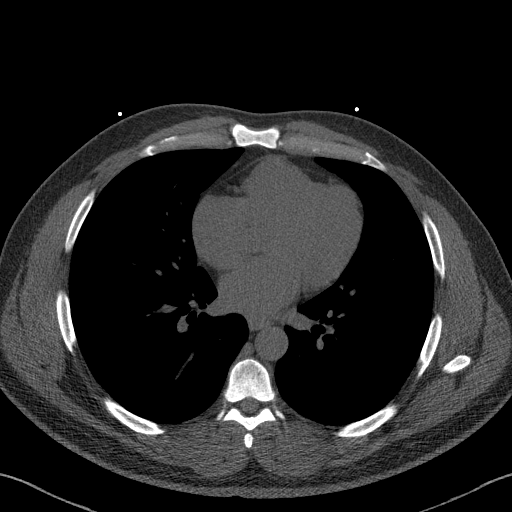
[im 45/68  vessel]
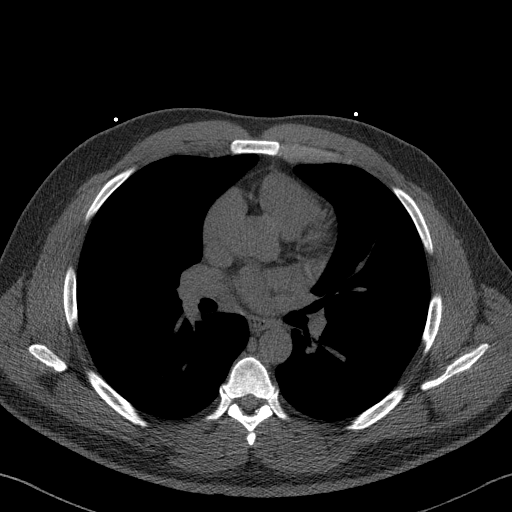
[im 56/68  vessel]
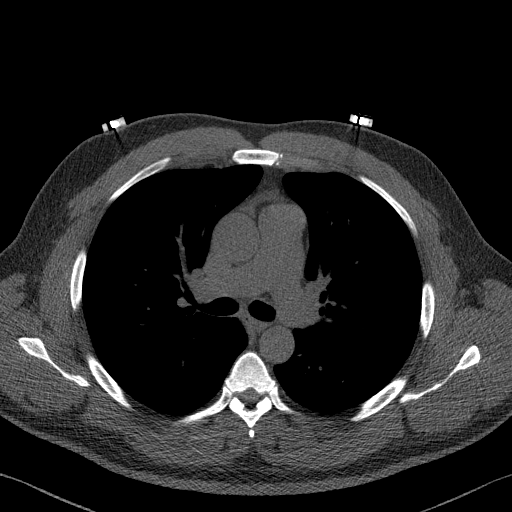
[im 56/68  lung]
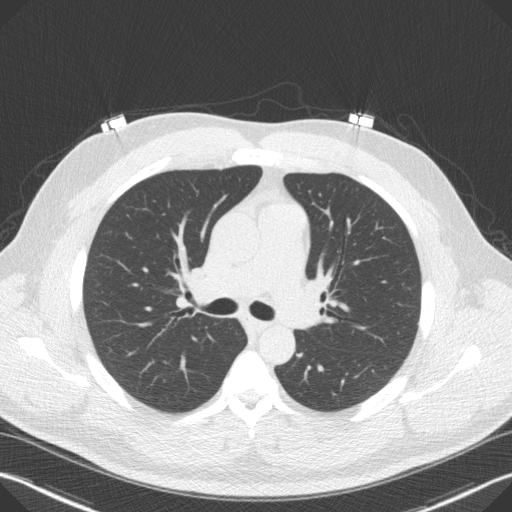

[Series 4: cascseq 2.0 br59 lung · axial · 0.73mm/px · z∈[-229,-141]mm · 5 of 68 slices shown]
[im 12/68  lung]
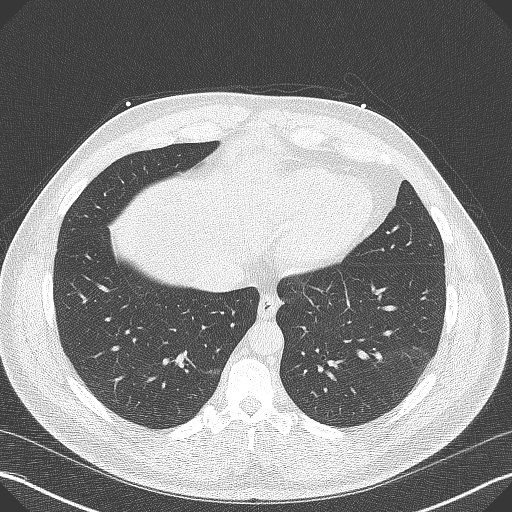
[im 23/68  lung]
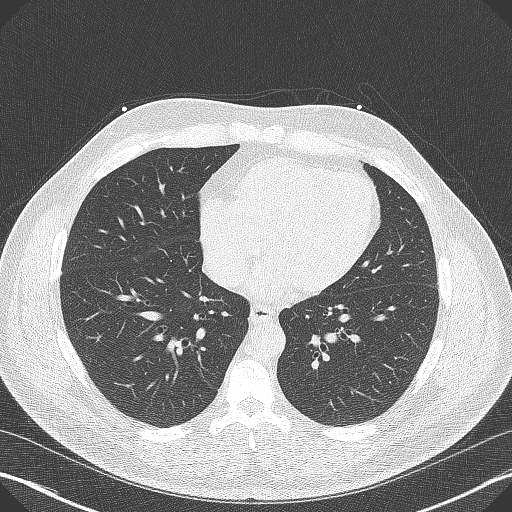
[im 34/68  lung]
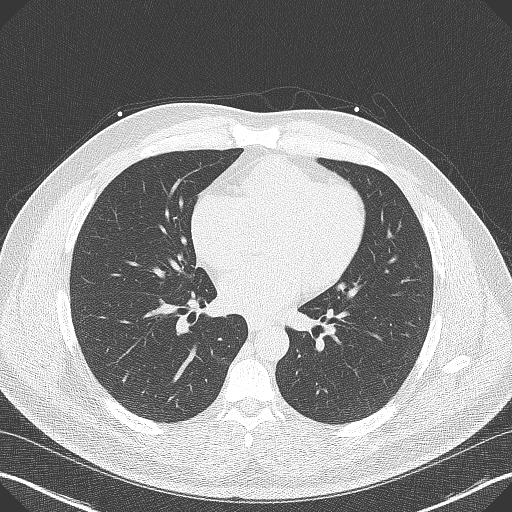
[im 45/68  lung]
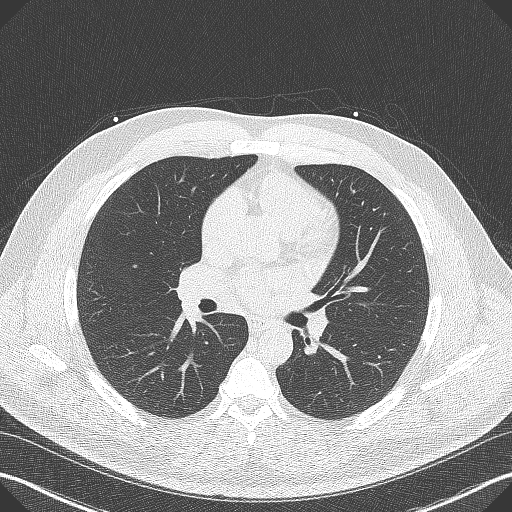
[im 56/68  lung]
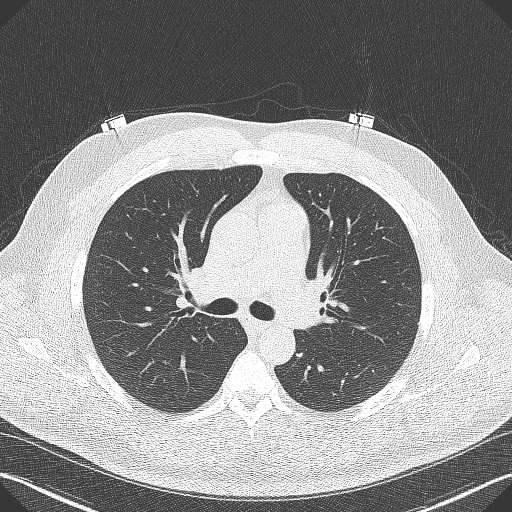

[14 of 20 positions shown; findings below may reference images not displayed]

FINDINGS: Coronary arteries: Normal origins.

Coronary Calcium Score:

Left main: 0

Left anterior descending artery: 0

Left circumflex artery: 0

Right coronary artery:

Total: 0

Percentile: 76th

Pericardium: Normal.

Ascending Aorta: Normal caliber.

Non-cardiac: See separate report from [REDACTED].
IMPRESSION: Coronary calcium score of 7.89 Agatston units. This was 76th
percentile for age-, race-, and sex-matched controls.



If CAC=0, it is reasonable to withhold statin therapy and reassess
in 5 to 10 years, as long as higher risk conditions are absent
(diabetes mellitus, family history of premature CHD in first degree
relatives (males <55 years; females <65 years), cigarette smoking,
or LDL >=190 mg/dL).

If CAC is 1 to 99, it is reasonable to initiate statin therapy for
patients >=55 years of age.

If CAC is >=100 or >=75th percentile, it is reasonable to initiate
statin therapy at any age.

Cardiology referral should be considered for patients with CAC
scores >=400 or >=75th percentile.

*0560 AHA/ACC/AACVPR/AAPA/ABC/AUAD/PAUCINAC/LKT/Gilkey/JUDESYS/KING YAN/KNUT LAGE
Guideline on the Management of Blood Cholesterol: A Report of the
American College of Cardiology/American Heart Association Task Force
on Clinical Practice Guidelines. J Am Coll Cardiol.
1366;73(24):6121-6362.

EXAM:
OVER-READ INTERPRETATION  CT CHEST

The following report is an over-read performed by radiologist Dr.
Ilyane Homme [REDACTED] on 03/18/2021. This over-read
does not include interpretation of cardiac or coronary anatomy or
pathology. The calcium score interpretation by the cardiologist is
attached.
FINDINGS: Vascular: Normal aortic caliber.

Mediastinum/Nodes: No imaged thoracic adenopathy. Minimal residual
thymic tissue in the anterior mediastinum.

Lungs/Pleura: No pleural fluid. Mild nodularity along the right
minor fissure is attributed to small subpleural lymph nodes.

Upper Abdomen: Suspect mild hepatic steatosis.

Musculoskeletal: No acute osseous abnormality.
IMPRESSION: No acute findings in the imaged extracardiac chest.

*** End of Addendum ***
FINDINGS: Coronary arteries: Normal origins.

Coronary Calcium Score:

Left main: 0

Left anterior descending artery: 0

Left circumflex artery: 0

Right coronary artery:

Total: 0

Percentile: 76th

Pericardium: Normal.

Ascending Aorta: Normal caliber.

Non-cardiac: See separate report from [REDACTED].
IMPRESSION: Coronary calcium score of 7.89 Agatston units. This was 76th
percentile for age-, race-, and sex-matched controls.



If CAC=0, it is reasonable to withhold statin therapy and reassess
in 5 to 10 years, as long as higher risk conditions are absent
(diabetes mellitus, family history of premature CHD in first degree
relatives (males <55 years; females <65 years), cigarette smoking,
or LDL >=190 mg/dL).

If CAC is 1 to 99, it is reasonable to initiate statin therapy for
patients >=55 years of age.

If CAC is >=100 or >=75th percentile, it is reasonable to initiate
statin therapy at any age.

Cardiology referral should be considered for patients with CAC
scores >=400 or >=75th percentile.

*0560 AHA/ACC/AACVPR/AAPA/ABC/AUAD/PAUCINAC/LKT/Gilkey/JUDESYS/KING YAN/KNUT LAGE
Guideline on the Management of Blood Cholesterol: A Report of the
American College of Cardiology/American Heart Association Task Force
on Clinical Practice Guidelines. J Am Coll Cardiol.
1366;73(24):6121-6362.

## 2024-03-13 ENCOUNTER — Ambulatory Visit: Admitting: Nurse Practitioner
